# Patient Record
Sex: Female | Born: 1985 | Race: White | Hispanic: No | Smoking: Never smoker
Health system: Southern US, Community
[De-identification: ages and names within clinical notes are randomized; demographics above are authoritative.]

## PROBLEM LIST (undated history)

## (undated) DIAGNOSIS — D649 Anemia, unspecified: Secondary | ICD-10-CM

## (undated) DIAGNOSIS — F419 Anxiety disorder, unspecified: Secondary | ICD-10-CM

## (undated) DIAGNOSIS — I10 Essential (primary) hypertension: Secondary | ICD-10-CM

## (undated) DIAGNOSIS — K760 Fatty (change of) liver, not elsewhere classified: Secondary | ICD-10-CM

## (undated) DIAGNOSIS — Z9889 Other specified postprocedural states: Secondary | ICD-10-CM

## (undated) DIAGNOSIS — J209 Acute bronchitis, unspecified: Secondary | ICD-10-CM

## (undated) DIAGNOSIS — R0600 Dyspnea, unspecified: Secondary | ICD-10-CM

## (undated) DIAGNOSIS — F909 Attention-deficit hyperactivity disorder, unspecified type: Secondary | ICD-10-CM

## (undated) DIAGNOSIS — F411 Generalized anxiety disorder: Secondary | ICD-10-CM

## (undated) DIAGNOSIS — Z903 Acquired absence of stomach [part of]: Secondary | ICD-10-CM

## (undated) DIAGNOSIS — G4730 Sleep apnea, unspecified: Secondary | ICD-10-CM

## (undated) DIAGNOSIS — S92351A Displaced fracture of fifth metatarsal bone, right foot, initial encounter for closed fracture: Secondary | ICD-10-CM

## (undated) DIAGNOSIS — Z8719 Personal history of other diseases of the digestive system: Secondary | ICD-10-CM

## (undated) DIAGNOSIS — F32A Depression, unspecified: Secondary | ICD-10-CM

## (undated) DIAGNOSIS — J45909 Unspecified asthma, uncomplicated: Secondary | ICD-10-CM

## (undated) DIAGNOSIS — K912 Postsurgical malabsorption, not elsewhere classified: Secondary | ICD-10-CM

## (undated) DIAGNOSIS — F321 Major depressive disorder, single episode, moderate: Secondary | ICD-10-CM

## (undated) HISTORY — PX: WISDOM TOOTH EXTRACTION: SHX21

## (undated) HISTORY — PX: ESOPHAGOGASTRODUODENOSCOPY: SHX1529

## (undated) HISTORY — PX: LAPAROSCOPIC GASTRIC RESTRICTIVE DUODENAL PROCEDURE (DUODENAL SWITCH): SHX6667

## (undated) HISTORY — PX: CHOLECYSTECTOMY: SHX55

---
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
## 2007-11-21 ENCOUNTER — Emergency Department: Payer: Self-pay | Admitting: Emergency Medicine

## 2009-02-02 ENCOUNTER — Emergency Department: Payer: Self-pay | Admitting: Internal Medicine

## 2009-04-24 ENCOUNTER — Emergency Department: Payer: Self-pay | Admitting: Emergency Medicine

## 2011-06-10 ENCOUNTER — Observation Stay: Payer: Self-pay | Admitting: Obstetrics and Gynecology

## 2011-07-09 ENCOUNTER — Observation Stay: Payer: Self-pay

## 2011-07-10 ENCOUNTER — Inpatient Hospital Stay: Payer: Self-pay | Admitting: Obstetrics and Gynecology

## 2011-07-10 LAB — CBC WITH DIFFERENTIAL/PLATELET
Basophil #: 0.1 10*3/uL (ref 0.0–0.1)
Eosinophil #: 0.2 10*3/uL (ref 0.0–0.7)
Eosinophil %: 1.4 %
HCT: 33.7 % — ABNORMAL LOW (ref 35.0–47.0)
Lymphocyte #: 2.2 10*3/uL (ref 1.0–3.6)
MCV: 85 fL (ref 80–100)
Monocyte #: 0.6 x10 3/mm (ref 0.2–0.9)
Monocyte %: 4.6 %
Neutrophil #: 9.3 10*3/uL — ABNORMAL HIGH (ref 1.4–6.5)
Neutrophil %: 75 %
WBC: 12.4 10*3/uL — ABNORMAL HIGH (ref 3.6–11.0)

## 2011-07-11 LAB — HEMATOCRIT: HCT: 32.5 % — ABNORMAL LOW (ref 35.0–47.0)

## 2012-04-07 ENCOUNTER — Emergency Department: Payer: Self-pay | Admitting: Emergency Medicine

## 2013-06-26 ENCOUNTER — Ambulatory Visit: Payer: Self-pay | Admitting: Bariatrics

## 2013-06-26 LAB — COMPREHENSIVE METABOLIC PANEL
ALK PHOS: 84 U/L
ALT: 23 U/L (ref 12–78)
ANION GAP: 5 — AB (ref 7–16)
AST: 19 U/L (ref 15–37)
Albumin: 3.4 g/dL (ref 3.4–5.0)
BILIRUBIN TOTAL: 0.3 mg/dL (ref 0.2–1.0)
BUN: 13 mg/dL (ref 7–18)
Calcium, Total: 8.7 mg/dL (ref 8.5–10.1)
Chloride: 107 mmol/L (ref 98–107)
Co2: 28 mmol/L (ref 21–32)
Creatinine: 0.73 mg/dL (ref 0.60–1.30)
EGFR (African American): >60
EGFR (Non-African Amer.): >60
Glucose: 101 mg/dL — ABNORMAL HIGH (ref 65–99)
Osmolality: 280 (ref 275–301)
Potassium: 3.9 mmol/L (ref 3.5–5.1)
SODIUM: 140 mmol/L (ref 136–145)
TOTAL PROTEIN: 6.9 g/dL (ref 6.4–8.2)

## 2013-06-26 LAB — TSH: Thyroid Stimulating Horm: 1.93 u[IU]/mL

## 2013-06-26 LAB — CBC WITH DIFFERENTIAL/PLATELET
BASOS PCT: 0.3 %
Basophil #: 0 10*3/uL (ref 0.0–0.1)
EOS ABS: 0.2 10*3/uL (ref 0.0–0.7)
Eosinophil %: 2.8 %
HCT: 34.4 % — ABNORMAL LOW (ref 35.0–47.0)
HGB: 11.2 g/dL — AB (ref 12.0–16.0)
Lymphocyte #: 1.7 10*3/uL (ref 1.0–3.6)
Lymphocyte %: 20.2 %
MCH: 25.4 pg — ABNORMAL LOW (ref 26.0–34.0)
MCHC: 32.5 g/dL (ref 32.0–36.0)
MCV: 78 fL — ABNORMAL LOW (ref 80–100)
MONO ABS: 0.5 x10 3/mm (ref 0.2–0.9)
MONOS PCT: 5.6 %
Neutrophil #: 6.1 10*3/uL (ref 1.4–6.5)
Neutrophil %: 71.1 %
Platelet: 264 10*3/uL (ref 150–440)
RBC: 4.41 10*6/uL (ref 3.80–5.20)
RDW: 14.9 % — ABNORMAL HIGH (ref 11.5–14.5)
WBC: 8.5 10*3/uL (ref 3.6–11.0)

## 2013-06-26 LAB — PROTIME-INR
INR: 1
Prothrombin Time: 13.1 secs (ref 11.5–14.7)

## 2013-06-26 LAB — PHOSPHORUS: Phosphorus: 3.4 mg/dL (ref 2.5–4.9)

## 2013-06-26 LAB — BILIRUBIN, DIRECT: Bilirubin, Direct: 0.1 mg/dL (ref 0.00–0.20)

## 2013-06-26 LAB — IRON AND TIBC
IRON: 48 ug/dL — AB (ref 50–170)
Iron Bind.Cap.(Total): 371 ug/dL (ref 250–450)
Iron Saturation: 13 %
Unbound Iron-Bind.Cap.: 323 ug/dL

## 2013-06-26 LAB — LIPASE, BLOOD: LIPASE: 165 U/L (ref 73–393)

## 2013-06-26 LAB — MAGNESIUM: Magnesium: 1.6 mg/dL — ABNORMAL LOW

## 2013-06-26 LAB — HEMOGLOBIN A1C: Hemoglobin A1C: 5.6 % (ref 4.2–6.3)

## 2013-06-26 LAB — FOLATE: Folic Acid: 18.6 ng/mL (ref 3.1–100.0)

## 2013-06-26 LAB — APTT: Activated PTT: 28.6 secs (ref 23.6–35.9)

## 2013-06-26 LAB — AMYLASE: AMYLASE: 48 U/L (ref 25–115)

## 2013-06-26 LAB — FERRITIN: FERRITIN (ARMC): 11 ng/mL (ref 8–388)

## 2013-07-17 ENCOUNTER — Ambulatory Visit: Payer: Self-pay | Admitting: Bariatrics

## 2013-08-01 ENCOUNTER — Ambulatory Visit: Payer: Self-pay | Admitting: Bariatrics

## 2013-09-22 HISTORY — PX: ABDOMINAL SURGERY: SHX537

## 2013-09-22 HISTORY — PX: HERNIA REPAIR: SHX51

## 2013-09-22 HISTORY — PX: GASTRIC BYPASS: SHX52

## 2014-07-02 NOTE — H&P (Signed)
L&D Evaluation:  History:   HPI 29 y/o G2 P1001 @ 39wks EDC 07/10/11 arrives with c/o SROM clear fluid 0530. Irreg mild uc's, small amount bloody show, baby is active. PNC @ [facility] Morbid obesity, anemia, asthma, GBS negative. GBS negative    Presents with leaking fluid    Patient's Medical History Asthma  Morbid Obesity    Patient's Surgical History none    Medications Pre Natal Vitamins  quvar, proventil, albuterol,    Allergies NKDA    Social History none    Family History Non-Contributory   ROS:   ROS All systems were reviewed.  HEENT, CNS, GI, GU, Respiratory, CV, Renal and Musculoskeletal systems were found to be normal.   Exam:   Vital Signs stable    Urine Protein not completed    General no apparent distress    Mental Status clear    Chest clear    Heart normal sinus rhythm    Abdomen gravid, non-tender    Estimated Fetal Weight Average for gestational age    Fetal Position vtx    Fundal Height term+    Back no CVAT    Edema 2+  pedal    Reflexes 2+    Clonus negative    Pelvic no external lesions, 4cm 50% vtx @ -2 clear fluid nl show    Description clear    FHT baseline 130's 140's avg variability with accels    FHT Description 136    Ucx irregular, mild    Skin dry    Lymph no lymphadenopathy   Impression:   Impression early labor   Plan:   Plan monitor contractions and for cervical change    Comments Ambulating, uc's continue to be mild asnd irregular. WIll begin pitocin augment. DC pain management options, plans IV pain meds, may request epidural. KNows body habitus possible limitations with epidural. FOB and friend at bedside supportive.   Electronic Signatures: Albertina ParrLugiano, Kino Dunsworth B (CNM)  (Signed 18-May-13 09:26)  Authored: L&D Evaluation   Last Updated: 18-May-13 09:26 by Albertina ParrLugiano, Samyak Sackmann B (CNM)

## 2014-11-29 DIAGNOSIS — J45909 Unspecified asthma, uncomplicated: Secondary | ICD-10-CM | POA: Insufficient documentation

## 2014-11-29 DIAGNOSIS — J4520 Mild intermittent asthma, uncomplicated: Secondary | ICD-10-CM | POA: Insufficient documentation

## 2014-12-09 DIAGNOSIS — Z01419 Encounter for gynecological examination (general) (routine) without abnormal findings: Secondary | ICD-10-CM | POA: Insufficient documentation

## 2014-12-09 DIAGNOSIS — Z903 Acquired absence of stomach [part of]: Secondary | ICD-10-CM | POA: Insufficient documentation

## 2015-05-12 ENCOUNTER — Emergency Department
Admission: EM | Admit: 2015-05-12 | Discharge: 2015-05-12 | Disposition: A | Discharge status: Other Acute Inpatient Hospital | Payer: PRIVATE HEALTH INSURANCE | Attending: Emergency Medicine | Admitting: Emergency Medicine

## 2015-05-12 ENCOUNTER — Encounter: Payer: Self-pay | Admitting: Emergency Medicine

## 2015-05-12 ENCOUNTER — Emergency Department: Payer: PRIVATE HEALTH INSURANCE

## 2015-05-12 DIAGNOSIS — Z79899 Other long term (current) drug therapy: Secondary | ICD-10-CM | POA: Insufficient documentation

## 2015-05-12 DIAGNOSIS — R1011 Right upper quadrant pain: Secondary | ICD-10-CM | POA: Insufficient documentation

## 2015-05-12 DIAGNOSIS — Z9884 Bariatric surgery status: Secondary | ICD-10-CM | POA: Diagnosis not present

## 2015-05-12 DIAGNOSIS — R109 Unspecified abdominal pain: Secondary | ICD-10-CM

## 2015-05-12 HISTORY — DX: Unspecified asthma, uncomplicated: J45.909

## 2015-05-12 LAB — URINALYSIS COMPLETE WITH MICROSCOPIC (ARMC ONLY)
BACTERIA UA: NONE SEEN
Bilirubin Urine: NEGATIVE
Glucose, UA: NEGATIVE mg/dL
Ketones, ur: NEGATIVE mg/dL
Leukocytes, UA: NEGATIVE
NITRITE: NEGATIVE
PH: 6 (ref 5.0–8.0)
PROTEIN: NEGATIVE mg/dL
SPECIFIC GRAVITY, URINE: 1.018 (ref 1.005–1.030)

## 2015-05-12 LAB — CBC WITH DIFFERENTIAL/PLATELET
BASOS ABS: 0 10*3/uL (ref 0–0.1)
BASOS PCT: 0 %
EOS ABS: 0.1 10*3/uL (ref 0–0.7)
EOS PCT: 2 %
HCT: 30 % — ABNORMAL LOW (ref 35.0–47.0)
Hemoglobin: 9.5 g/dL — ABNORMAL LOW (ref 12.0–16.0)
LYMPHS PCT: 29 %
Lymphs Abs: 2.2 10*3/uL (ref 1.0–3.6)
MCH: 22.4 pg — ABNORMAL LOW (ref 26.0–34.0)
MCHC: 31.8 g/dL — ABNORMAL LOW (ref 32.0–36.0)
MCV: 70.6 fL — AB (ref 80.0–100.0)
Monocytes Absolute: 0.5 10*3/uL (ref 0.2–0.9)
Monocytes Relative: 6 %
Neutro Abs: 4.7 10*3/uL (ref 1.4–6.5)
Neutrophils Relative %: 63 %
PLATELETS: 282 10*3/uL (ref 150–440)
RBC: 4.25 MIL/uL (ref 3.80–5.20)
RDW: 17 % — ABNORMAL HIGH (ref 11.5–14.5)
WBC: 7.6 10*3/uL (ref 3.6–11.0)

## 2015-05-12 LAB — COMPREHENSIVE METABOLIC PANEL
ALBUMIN: 3.9 g/dL (ref 3.5–5.0)
ALK PHOS: 195 U/L — AB (ref 38–126)
ALT: 58 U/L — AB (ref 14–54)
ANION GAP: 4 — AB (ref 5–15)
AST: 121 U/L — ABNORMAL HIGH (ref 15–41)
BUN: 11 mg/dL (ref 6–20)
CALCIUM: 8.3 mg/dL — AB (ref 8.9–10.3)
CO2: 27 mmol/L (ref 22–32)
CREATININE: 0.65 mg/dL (ref 0.44–1.00)
Chloride: 107 mmol/L (ref 101–111)
GFR calc Af Amer: >60 mL/min (ref >60)
GFR calc non Af Amer: >60 mL/min (ref >60)
GLUCOSE: 103 mg/dL — AB (ref 65–99)
Potassium: 3 mmol/L — ABNORMAL LOW (ref 3.5–5.1)
SODIUM: 138 mmol/L (ref 135–145)
Total Bilirubin: 0.4 mg/dL (ref 0.3–1.2)
Total Protein: 7.1 g/dL (ref 6.5–8.1)

## 2015-05-12 LAB — LIPASE, BLOOD: Lipase: 28 U/L (ref 11–51)

## 2015-05-12 MED ORDER — MORPHINE SULFATE (PF) 4 MG/ML IV SOLN
INTRAVENOUS | Status: AC
  Administered 2015-05-12: 4 mg via INTRAVENOUS
  Filled 2015-05-12: qty 1

## 2015-05-12 MED ORDER — ONDANSETRON HCL 4 MG/2ML IJ SOLN: 4.0000 mg | Freq: Once | INTRAMUSCULAR | Status: DC

## 2015-05-12 MED ORDER — MORPHINE SULFATE (PF) 4 MG/ML IV SOLN
4.0000 mg | Freq: Once | INTRAVENOUS | Status: AC
  Administered 2015-05-12: 4 mg via INTRAVENOUS
  Filled 2015-05-12: qty 1

## 2015-05-12 MED ORDER — ONDANSETRON HCL 4 MG/2ML IJ SOLN
4.0000 mg | Freq: Once | INTRAMUSCULAR | Status: AC
  Administered 2015-05-12: 4 mg via INTRAVENOUS

## 2015-05-12 MED ORDER — SODIUM CHLORIDE 0.9 % IV BOLUS (SEPSIS)
500.0000 mL | Freq: Once | INTRAVENOUS | Status: AC
  Administered 2015-05-12: 500 mL via INTRAVENOUS

## 2015-05-12 MED ORDER — ONDANSETRON HCL 4 MG/2ML IJ SOLN
4.0000 mg | Freq: Once | INTRAMUSCULAR | Status: AC
  Administered 2015-05-12: 4 mg via INTRAVENOUS
  Filled 2015-05-12: qty 2

## 2015-05-12 MED ORDER — ONDANSETRON HCL 4 MG/2ML IJ SOLN
INTRAMUSCULAR | Status: AC
  Filled 2015-05-12: qty 2

## 2015-05-12 MED ORDER — MORPHINE SULFATE (PF) 4 MG/ML IV SOLN
4.0000 mg | Freq: Once | INTRAVENOUS | Status: AC
  Administered 2015-05-12: 4 mg via INTRAVENOUS

## 2015-05-12 MED ORDER — MORPHINE SULFATE (PF) 2 MG/ML IV SOLN: 2.0000 mg | Freq: Once | INTRAVENOUS | Status: DC

## 2015-05-12 NOTE — ED Notes (Signed)
Pt states bilateral upper quadrant pain that is worse with yawning or "burping" per pt. Pt states has had pain several times in past, had ultrasound of her gallbladder and liver and "everything was fine, they couldn't find anything wrong." skin pwd, pt appears in no acute distress. Pt denies known fever. Pt with chronic soft stools due to "weight loss surgery."

## 2015-05-12 NOTE — ED Notes (Signed)
Pt ambulatory to triage CO abdominal 9/10 in the epigastric area spreading across umbilicus area, pt had gastric bypass (duodenal switch) aug 2015, Dr Alva Garnetyner.   Pt reports this is the 3rd occurrence of this type of pain in the last month.  Pain reported as sharp and pt reports unablew to get relief by position, this pain woke pt.  Pt reports having her gallbladder and appendix.   Pt areas grimacing, groaning  and restless in triage.

## 2015-05-12 NOTE — ED Notes (Signed)
Pt remains in ultrasound.

## 2015-05-12 NOTE — ED Provider Notes (Addendum)
[facility] Provider Note  ____________________________________________   I have reviewed the triage vital signs and the nursing notes.   HISTORY  Chief Complaint Abdominal Pain    HPI Brooke Pearson is a 30 y.o. female who had gastric bypass surgery approximately a year and a half ago with a significant weight loss since then. States that prior to her surgery she did have an ultrasound which was negative for gallstones. States over the last months that to 3 episodes of right upper quadrant pain which has been significant. The last 2 times it was transitory this time, however, it lasted. She denies any fever or chills or nausea or vomiting. No diarrhea or dysuria.She states that aside from a focal right upper quadrant pain she doesn't feel unwell.  Past Medical History  Diagnosis Date  . Asthma     There are no active problems to display for this patient.   Past Surgical History  Procedure Laterality Date  . Abdominal surgery  aug 2015    gastric bypass  . Gastric bypass  aug 2015  . Hernia repair  aug 2015    Current Outpatient Rx  Name  Route  Sig  Dispense  Refill  . citalopram (CELEXA) 10 MG tablet   Oral   Take 1 tablet by mouth daily.         Marland Kitchen. etonogestrel-ethinyl estradiol (NUVARING) 0.12-0.015 MG/24HR vaginal ring   Vaginal   Place vaginally.         . traZODone (DESYREL) 50 MG tablet   Oral   Take 1 tablet by mouth at bedtime.           Allergies Review of patient's allergies indicates no known allergies.  History reviewed. No pertinent family history.  Social History Social History  Substance Use Topics  . Smoking status: Never Smoker   . Smokeless tobacco: None  . Alcohol Use: Yes     Comment: social    Review of Systems Constitutional: No fever/chills Eyes: No visual changes. ENT: No sore throat. No stiff neck no neck pain Cardiovascular:  Denies chest pain. Respiratory: Denies shortness of breath. Gastrointestinal:   no vomiting.  No diarrhea.  No constipation. Genitourinary: Negative for dysuria. Musculoskeletal: Negative lower extremity swelling Skin: Negative for rash. Neurological: Negative for headaches, focal weakness or numbness. 10-point ROS otherwise negative.  ____________________________________________   PHYSICAL EXAM:  VITAL SIGNS: ED Triage Vitals  Enc Vitals Group     BP 05/12/15 0133 144/90 mmHg     Pulse Rate 05/12/15 0133 82     Resp 05/12/15 0133 18     Temp 05/12/15 0133 98.2 F (36.8 C)     Temp Source 05/12/15 0133 Oral     SpO2 05/12/15 0133 100 %     Weight 05/12/15 0133 215 lb (97.523 kg)     Height 05/12/15 0133 5\' 7"  (1.702 m)     Head Cir --      Peak Flow --      Pain Score 05/12/15 0153 9     Pain Loc --      Pain Edu? --      Excl. in GC? --     Constitutional: Alert and oriented. Well appearing and in no acute distress. Eyes: Conjunctivae are normal. PERRL. EOMI. Head: Atraumatic. Nose: No congestion/rhinnorhea. Mouth/Throat: Mucous membranes are moist.  Oropharynx non-erythematous. Neck: No stridor.   Nontender with no meningismus Cardiovascular: Normal rate, regular rhythm. Grossly  normal heart sounds.  Good peripheral circulation. Respiratory: Normal respiratory effort.  No retractions. Lungs CTAB. Abdominal: She is tender to palpation the right upper quadrant which reproduces her pain.. No distention. No guarding no rebound Back:  There is no focal tenderness or step off there is no midline tenderness there are no lesions noted. there is no CVA tenderness Musculoskeletal: No lower extremity tenderness. No joint effusions, no DVT signs strong distal pulses no edema Neurologic:  Normal speech and language. No gross focal neurologic deficits are appreciated.  Skin:  Skin is warm, dry and intact. No rash noted. Psychiatric: Mood and affect are normal. Speech and behavior  are normal.  ____________________________________________   LABS (all labs ordered are listed, but only abnormal results are displayed)  Labs Reviewed  COMPREHENSIVE METABOLIC PANEL - Abnormal; Notable for the following:    Potassium 3.0 (*)    Glucose, Bld 103 (*)    Calcium 8.3 (*)    AST 121 (*)    ALT 58 (*)    Alkaline Phosphatase 195 (*)    Anion gap 4 (*)    All other components within normal limits  URINALYSIS COMPLETEWITH MICROSCOPIC ([facility] ONLY) - Abnormal; Notable for the following:    Color, Urine YELLOW (*)    APPearance CLEAR (*)    Hgb urine dipstick 1+ (*)    Squamous Epithelial / LPF 0-5 (*)    All other components within normal limits  CBC WITH DIFFERENTIAL/PLATELET - Abnormal; Notable for the following:    Hemoglobin 9.5 (*)    HCT 30.0 (*)    MCV 70.6 (*)    MCH 22.4 (*)    MCHC 31.8 (*)    RDW 17.0 (*)    All other components within normal limits  LIPASE, BLOOD   ____________________________________________  EKG  I personally interpreted any EKGs ordered by me or triage  ____________________________________________  RADIOLOGY  I reviewed any imaging ordered by me or triage that were performed during my shift and, if possible, patient and/or family made aware of any abnormal findings. ____________________________________________   PROCEDURES  Procedure(s) performed: None  Critical Care performed: None  ____________________________________________   INITIAL IMPRESSION / ASSESSMENT AND PLAN / ED COURSE  Pertinent labs & imaging results that were available during my care of the patient were reviewed by me and considered in my medical decision making (see chart for details).  Patient with a nonsurgical abdomen presents with right upper quadrant pain. She has had however gastric bypass with somewhat palpates the situation. However her symptoms a classically similar to gallbladder symptoms given the recurrent nature, the focal nature in the  right upper quadrant, and her elevated alkaline phosphatase and the reflection test and I did begin with an ultrasound which does show gallstones. I do suspect this is biliary colic. Her abdomen is otherwise benign. Blood work is otherwise reassuring. We have given her pain medication we will see how she feels. At this time, given her ultrasound, this is more in the realm of biliary colic then acute cholecystitis at least in terms of imaging. __________  ----------------------------------------- 7:42 AM on 05/12/2015 -----------------------------------------  Discussed with the on-call PA for her surgeon, Dr. Alva Garnet, they will call back with disposition. Signed out at the end of my shift to Dr. Cyril Loosen  __________________________________   FINAL CLINICAL IMPRESSION(S) / ED DIAGNOSES  Final diagnoses:  Abdominal pain      This chart was dictated using voice recognition software.  Despite best efforts to proofread,  errors can occur which can change meaning.     Jeanmarie Plant, MD 05/12/15 1610  Jeanmarie Plant, MD 05/12/15 [phone]

## 2015-05-12 NOTE — ED Notes (Signed)
Pt with gallstones per ct scan. Possible admission.

## 2015-05-12 NOTE — ED Notes (Signed)
Pt returned from ultrasound

## 2015-05-12 NOTE — ED Notes (Signed)
[facility], will be about an hour an a half before they arrive.

## 2015-05-12 NOTE — ED Notes (Signed)
Pt to ultrasound

## 2015-05-12 NOTE — ED Notes (Signed)
Pt resting on stretcher [facility].

## 2015-05-12 NOTE — ED Notes (Signed)
Warm blankets provided, call bell at side. Lights dimmed for comfort.

## 2015-05-12 NOTE — ED Notes (Signed)
Report to monica, rn

## 2015-11-02 ENCOUNTER — Observation Stay
Admission: EM | Admit: 2015-11-02 | Discharge: 2015-11-04 | Disposition: A | Discharge status: Home / Self Care | Payer: PRIVATE HEALTH INSURANCE | Attending: Surgery | Admitting: Surgery

## 2015-11-02 ENCOUNTER — Encounter
Admission: EM | Disposition: A | Discharge status: Home / Self Care | Payer: Self-pay | Source: Home / Self Care | Attending: Emergency Medicine

## 2015-11-02 ENCOUNTER — Observation Stay: Payer: PRIVATE HEALTH INSURANCE | Admitting: Anesthesiology

## 2015-11-02 ENCOUNTER — Emergency Department: Payer: PRIVATE HEALTH INSURANCE

## 2015-11-02 DIAGNOSIS — K3580 Unspecified acute appendicitis: Secondary | ICD-10-CM | POA: Diagnosis not present

## 2015-11-02 DIAGNOSIS — N83521 Torsion of right fallopian tube: Secondary | ICD-10-CM | POA: Insufficient documentation

## 2015-11-02 DIAGNOSIS — J45909 Unspecified asthma, uncomplicated: Secondary | ICD-10-CM | POA: Insufficient documentation

## 2015-11-02 DIAGNOSIS — Z79891 Long term (current) use of opiate analgesic: Secondary | ICD-10-CM | POA: Insufficient documentation

## 2015-11-02 DIAGNOSIS — Z23 Encounter for immunization: Secondary | ICD-10-CM | POA: Insufficient documentation

## 2015-11-02 DIAGNOSIS — Z79899 Other long term (current) drug therapy: Secondary | ICD-10-CM | POA: Diagnosis not present

## 2015-11-02 DIAGNOSIS — Z793 Long term (current) use of hormonal contraceptives: Secondary | ICD-10-CM | POA: Insufficient documentation

## 2015-11-02 DIAGNOSIS — Z9889 Other specified postprocedural states: Secondary | ICD-10-CM | POA: Insufficient documentation

## 2015-11-02 DIAGNOSIS — N83529 Torsion of fallopian tube, unspecified side: Secondary | ICD-10-CM

## 2015-11-02 DIAGNOSIS — K353 Acute appendicitis with localized peritonitis, without perforation or gangrene: Secondary | ICD-10-CM

## 2015-11-02 DIAGNOSIS — Z9884 Bariatric surgery status: Secondary | ICD-10-CM | POA: Diagnosis not present

## 2015-11-02 DIAGNOSIS — Z9049 Acquired absence of other specified parts of digestive tract: Secondary | ICD-10-CM | POA: Diagnosis not present

## 2015-11-02 HISTORY — PX: LAPAROSCOPIC UNILATERAL SALPINGECTOMY: SHX5934

## 2015-11-02 HISTORY — DX: Torsion of fallopian tube, unspecified side: N83.529

## 2015-11-02 HISTORY — PX: LAPAROSCOPIC APPENDECTOMY: SHX408

## 2015-11-02 LAB — COMPREHENSIVE METABOLIC PANEL WITH GFR
ALT: 18 U/L (ref 14–54)
AST: 15 U/L (ref 15–41)
Albumin: 3.7 g/dL (ref 3.5–5.0)
Alkaline Phosphatase: 105 U/L (ref 38–126)
Anion gap: 4 — ABNORMAL LOW (ref 5–15)
BUN: 10 mg/dL (ref 6–20)
CO2: 24 mmol/L (ref 22–32)
Calcium: 8.7 mg/dL — ABNORMAL LOW (ref 8.9–10.3)
Chloride: 111 mmol/L (ref 101–111)
Creatinine, Ser: 0.58 mg/dL (ref 0.44–1.00)
GFR calc Af Amer: >60 mL/min
GFR calc non Af Amer: >60 mL/min
Glucose, Bld: 91 mg/dL (ref 65–99)
Potassium: 3.4 mmol/L — ABNORMAL LOW (ref 3.5–5.1)
Sodium: 139 mmol/L (ref 135–145)
Total Bilirubin: 0.5 mg/dL (ref 0.3–1.2)
Total Protein: 7 g/dL (ref 6.5–8.1)

## 2015-11-02 LAB — POCT PREGNANCY, URINE: Preg Test, Ur: NEGATIVE

## 2015-11-02 LAB — URINALYSIS COMPLETE WITH MICROSCOPIC (ARMC ONLY)
Bilirubin Urine: NEGATIVE
Glucose, UA: NEGATIVE mg/dL
Hgb urine dipstick: NEGATIVE
Ketones, ur: NEGATIVE mg/dL
Nitrite: NEGATIVE
Protein, ur: NEGATIVE mg/dL
Specific Gravity, Urine: 1.024 (ref 1.005–1.030)
pH: 5 (ref 5.0–8.0)

## 2015-11-02 LAB — LIPASE, BLOOD: Lipase: 28 U/L (ref 11–51)

## 2015-11-02 LAB — CBC
HCT: 28.6 % — ABNORMAL LOW (ref 35.0–47.0)
Hemoglobin: 9.3 g/dL — ABNORMAL LOW (ref 12.0–16.0)
MCH: 22.2 pg — ABNORMAL LOW (ref 26.0–34.0)
MCHC: 32.6 g/dL (ref 32.0–36.0)
MCV: 68.1 fL — ABNORMAL LOW (ref 80.0–100.0)
Platelets: 227 K/uL (ref 150–440)
RBC: 4.2 MIL/uL (ref 3.80–5.20)
RDW: 18.6 % — ABNORMAL HIGH (ref 11.5–14.5)
WBC: 8.2 K/uL (ref 3.6–11.0)

## 2015-11-02 SURGERY — APPENDECTOMY, LAPAROSCOPIC
Anesthesia: General | Laterality: Right | Wound class: Clean Contaminated

## 2015-11-02 MED ORDER — GLYCOPYRROLATE 0.2 MG/ML IJ SOLN
INTRAMUSCULAR | Status: DC | PRN
  Administered 2015-11-02: .6 mg via INTRAVENOUS

## 2015-11-02 MED ORDER — MIDAZOLAM HCL 2 MG/2ML IJ SOLN
INTRAMUSCULAR | Status: DC | PRN
  Administered 2015-11-02: 2 mg via INTRAVENOUS

## 2015-11-02 MED ORDER — PIPERACILLIN-TAZOBACTAM 3.375 G IVPB
INTRAVENOUS | Status: AC
Start: 2015-11-02 — End: 2015-11-02
  Administered 2015-11-02: 3.375 g via INTRAVENOUS
  Filled 2015-11-02: qty 50

## 2015-11-02 MED ORDER — KETOROLAC TROMETHAMINE 30 MG/ML IJ SOLN
INTRAMUSCULAR | Status: DC | PRN
  Administered 2015-11-02: 30 mg via INTRAVENOUS

## 2015-11-02 MED ORDER — PROPOFOL 10 MG/ML IV BOLUS
INTRAVENOUS | Status: DC | PRN
  Administered 2015-11-02: 200 mg via INTRAVENOUS

## 2015-11-02 MED ORDER — MORPHINE SULFATE (PF) 4 MG/ML IV SOLN
4.0000 mg | Freq: Once | INTRAVENOUS | Status: AC
  Administered 2015-11-02: 4 mg via INTRAVENOUS

## 2015-11-02 MED ORDER — OXYCODONE HCL 5 MG PO TABS: 5.0000 mg | ORAL_TABLET | Freq: Once | ORAL | Status: DC | PRN

## 2015-11-02 MED ORDER — PIPERACILLIN-TAZOBACTAM 3.375 G IVPB
3.3750 g | Freq: Three times a day (TID) | INTRAVENOUS | Status: DC
Start: 2015-11-03 — End: 2015-11-02
  Filled 2015-11-02 (×2): qty 50

## 2015-11-02 MED ORDER — PIPERACILLIN-TAZOBACTAM 3.375 G IVPB 30 MIN
3.3750 g | Freq: Once | INTRAVENOUS | Status: AC
  Administered 2015-11-02: 3.375 g via INTRAVENOUS

## 2015-11-02 MED ORDER — IOPAMIDOL (ISOVUE-300) INJECTION 61%
30.0000 mL | Freq: Once | INTRAVENOUS | Status: AC | PRN
  Administered 2015-11-02: 30 mL via ORAL
  Filled 2015-11-02: qty 30

## 2015-11-02 MED ORDER — IOPAMIDOL (ISOVUE-300) INJECTION 61%
100.0000 mL | Freq: Once | INTRAVENOUS | Status: AC | PRN
  Administered 2015-11-02: 100 mL via INTRAVENOUS
  Filled 2015-11-02: qty 100

## 2015-11-02 MED ORDER — FENTANYL CITRATE (PF) 100 MCG/2ML IJ SOLN
INTRAMUSCULAR | Status: DC | PRN
  Administered 2015-11-02 (×2): 100 ug via INTRAVENOUS

## 2015-11-02 MED ORDER — MORPHINE SULFATE (PF) 4 MG/ML IV SOLN
INTRAVENOUS | Status: AC
  Filled 2015-11-02: qty 1

## 2015-11-02 MED ORDER — LACTATED RINGERS IV SOLN
INTRAVENOUS | Status: DC | PRN
  Administered 2015-11-02 (×2): via INTRAVENOUS

## 2015-11-02 MED ORDER — ACETAMINOPHEN 650 MG RE SUPP: 650.0000 mg | Freq: Four times a day (QID) | RECTAL | Status: DC | PRN

## 2015-11-02 MED ORDER — ALBUTEROL SULFATE (2.5 MG/3ML) 0.083% IN NEBU
3.0000 mL | INHALATION_SOLUTION | RESPIRATORY_TRACT | Status: DC | PRN
  Administered 2015-11-02 – 2015-11-04 (×3): 3 mL via RESPIRATORY_TRACT
  Filled 2015-11-02 (×2): qty 3
  Filled 2015-11-02: qty 18

## 2015-11-02 MED ORDER — MORPHINE SULFATE (PF) 4 MG/ML IV SOLN
4.0000 mg | INTRAVENOUS | Status: DC | PRN
  Administered 2015-11-02 – 2015-11-03 (×2): 4 mg via INTRAVENOUS
  Filled 2015-11-02 (×2): qty 1

## 2015-11-02 MED ORDER — SODIUM CHLORIDE 0.9 % IV SOLN
INTRAVENOUS | Status: DC | PRN
  Administered 2015-11-02: 1000 mL via INTRAMUSCULAR

## 2015-11-02 MED ORDER — HYDROCODONE-ACETAMINOPHEN 5-325 MG PO TABS
1.0000 | ORAL_TABLET | ORAL | Status: DC | PRN
  Administered 2015-11-03 (×2): 1 via ORAL
  Filled 2015-11-02 (×2): qty 1

## 2015-11-02 MED ORDER — ONDANSETRON 4 MG PO TBDP
4.0000 mg | ORAL_TABLET | Freq: Four times a day (QID) | ORAL | Status: DC | PRN
  Administered 2015-11-03: 4 mg via ORAL
  Filled 2015-11-02: qty 1

## 2015-11-02 MED ORDER — ONDANSETRON HCL 4 MG/2ML IJ SOLN
4.0000 mg | Freq: Four times a day (QID) | INTRAMUSCULAR | Status: DC | PRN
  Administered 2015-11-02 – 2015-11-04 (×4): 4 mg via INTRAVENOUS
  Filled 2015-11-02 (×5): qty 2

## 2015-11-02 MED ORDER — DEXAMETHASONE SODIUM PHOSPHATE 10 MG/ML IJ SOLN
INTRAMUSCULAR | Status: DC | PRN
  Administered 2015-11-02: 5 mg via INTRAVENOUS

## 2015-11-02 MED ORDER — OXYCODONE HCL 5 MG/5ML PO SOLN: 5.0000 mg | Freq: Once | ORAL | Status: DC | PRN

## 2015-11-02 MED ORDER — ONDANSETRON HCL 4 MG/2ML IJ SOLN
4.0000 mg | Freq: Once | INTRAMUSCULAR | Status: AC
  Administered 2015-11-02: 4 mg via INTRAVENOUS

## 2015-11-02 MED ORDER — NEOSTIGMINE METHYLSULFATE 10 MG/10ML IV SOLN
INTRAVENOUS | Status: DC | PRN
  Administered 2015-11-02: 4 mg via INTRAVENOUS

## 2015-11-02 MED ORDER — ROCURONIUM BROMIDE 100 MG/10ML IV SOLN
INTRAVENOUS | Status: DC | PRN
  Administered 2015-11-02: 35 mg via INTRAVENOUS

## 2015-11-02 MED ORDER — ACETAMINOPHEN 10 MG/ML IV SOLN
INTRAVENOUS | Status: DC | PRN
  Administered 2015-11-02: 1000 mg via INTRAVENOUS

## 2015-11-02 MED ORDER — PROMETHAZINE HCL 25 MG/ML IJ SOLN: 6.2500 mg | INTRAMUSCULAR | Status: DC | PRN

## 2015-11-02 MED ORDER — ACETAMINOPHEN 325 MG PO TABS: 650.0000 mg | ORAL_TABLET | Freq: Four times a day (QID) | ORAL | Status: DC | PRN

## 2015-11-02 MED ORDER — LIDOCAINE HCL (CARDIAC) 20 MG/ML IV SOLN
INTRAVENOUS | Status: DC | PRN
  Administered 2015-11-02: 30 mg via INTRAVENOUS

## 2015-11-02 MED ORDER — PANTOPRAZOLE SODIUM 40 MG PO TBEC
40.0000 mg | DELAYED_RELEASE_TABLET | Freq: Two times a day (BID) | ORAL | Status: DC
  Administered 2015-11-02 – 2015-11-04 (×4): 40 mg via ORAL
  Filled 2015-11-02 (×4): qty 1

## 2015-11-02 MED ORDER — KCL IN DEXTROSE-NACL 40-5-0.45 MEQ/L-%-% IV SOLN
INTRAVENOUS | Status: DC
  Administered 2015-11-02 – 2015-11-04 (×3): via INTRAVENOUS
  Filled 2015-11-02 (×6): qty 1000

## 2015-11-02 MED ORDER — ONDANSETRON HCL 4 MG/2ML IJ SOLN
INTRAMUSCULAR | Status: AC
  Administered 2015-11-02: 4 mg via INTRAVENOUS
  Filled 2015-11-02: qty 2

## 2015-11-02 MED ORDER — PNEUMOCOCCAL VAC POLYVALENT 25 MCG/0.5ML IJ INJ
0.5000 mL | INJECTION | INTRAMUSCULAR | Status: AC
  Administered 2015-11-03: 0.5 mL via INTRAMUSCULAR
  Filled 2015-11-02: qty 0.5

## 2015-11-02 MED ORDER — FENTANYL CITRATE (PF) 100 MCG/2ML IJ SOLN: 25.0000 ug | INTRAMUSCULAR | Status: DC | PRN

## 2015-11-02 MED ORDER — BUPIVACAINE HCL (PF) 0.25 % IJ SOLN
INTRAMUSCULAR | Status: DC | PRN
Start: 2015-11-02 — End: 2015-11-02
  Administered 2015-11-02: 30 mL

## 2015-11-02 MED ORDER — ONDANSETRON HCL 4 MG/2ML IJ SOLN
INTRAMUSCULAR | Status: DC | PRN
  Administered 2015-11-02: 4 mg via INTRAVENOUS

## 2015-11-02 SURGICAL SUPPLY — 37 items
ANCHOR TIS RET SYS 235ML (MISCELLANEOUS) ×4 IMPLANT
CANISTER SUCT 3000ML (MISCELLANEOUS) ×4 IMPLANT
CHLORAPREP W/TINT 26ML (MISCELLANEOUS) ×4 IMPLANT
CUTTER FLEX LINEAR 45M (STAPLE) ×4 IMPLANT
DRSG TEGADERM 2-3/8X2-3/4 SM (GAUZE/BANDAGES/DRESSINGS) ×4 IMPLANT
DRSG TELFA 3X8 NADH (GAUZE/BANDAGES/DRESSINGS) ×4 IMPLANT
ELECT REM PT RETURN 9FT ADLT (ELECTROSURGICAL) ×4
ELECTRODE REM PT RTRN 9FT ADLT (ELECTROSURGICAL) ×2 IMPLANT
ENDOPOUCH RETRIEVER 10 (MISCELLANEOUS) ×4 IMPLANT
GLOVE BIO SURGEON STRL SZ7.5 (GLOVE) ×12 IMPLANT
GLOVE INDICATOR 8.0 STRL GRN (GLOVE) ×12 IMPLANT
GOWN STRL REUS W/ TWL LRG LVL3 (GOWN DISPOSABLE) ×6 IMPLANT
GOWN STRL REUS W/TWL LRG LVL3 (GOWN DISPOSABLE) ×6
GRASPER SUT TROCAR 14GX15 (MISCELLANEOUS) ×4 IMPLANT
IRRIGATION STRYKERFLOW (MISCELLANEOUS) ×2 IMPLANT
IRRIGATOR STRYKERFLOW (MISCELLANEOUS) ×4
IV NS 1000ML (IV SOLUTION) ×4
IV NS 1000ML BAXH (IV SOLUTION) ×4 IMPLANT
KIT RM TURNOVER STRD PROC AR (KITS) ×4 IMPLANT
NEEDLE FILTER BLUNT 18X 1/2SAF (NEEDLE) ×2
NEEDLE FILTER BLUNT 18X1 1/2 (NEEDLE) ×2 IMPLANT
NEEDLE HYPO 25X1 1.5 SAFETY (NEEDLE) ×4 IMPLANT
NEEDLE INSUFFLATION 14GA 120MM (NEEDLE) ×4 IMPLANT
NS IRRIG 500ML POUR BTL (IV SOLUTION) ×4 IMPLANT
PACK LAP CHOLECYSTECTOMY (MISCELLANEOUS) ×4 IMPLANT
POUCH ENDO CATCH 10MM SPEC (MISCELLANEOUS) ×4 IMPLANT
RELOAD 45 VASCULAR/THIN (ENDOMECHANICALS) ×4 IMPLANT
SCISSORS METZENBAUM CVD 33 (INSTRUMENTS) ×4 IMPLANT
SHEARS HARMONIC ACE PLUS 36CM (ENDOMECHANICALS) ×4 IMPLANT
SUT ETHILON 5-0 FS-2 18 BLK (SUTURE) ×8 IMPLANT
SUT VIC AB 0 CT2 27 (SUTURE) ×4 IMPLANT
SYRINGE 10CC LL (SYRINGE) ×4 IMPLANT
TROCAR XCEL 12X100 BLDLESS (ENDOMECHANICALS) ×4 IMPLANT
TROCAR Z-THREAD FIOS 11X100 BL (TROCAR) ×4 IMPLANT
TROCAR Z-THREAD OPTICAL 5X100M (TROCAR) IMPLANT
TROCAR Z-THREAD SLEEVE 11X100 (TROCAR) ×4 IMPLANT
TUBING INSUFFLATOR HI FLOW (MISCELLANEOUS) ×4 IMPLANT

## 2015-11-02 NOTE — ED Notes (Signed)
MD at bedside. 

## 2015-11-02 NOTE — Anesthesia Procedure Notes (Signed)
Procedure Name: Intubation Performed by: Paytience Bures Pre-anesthesia Checklist: Patient identified, Patient being monitored, Timeout performed, Emergency Drugs available and Suction available Patient Re-evaluated:Patient Re-evaluated prior to inductionOxygen Delivery Method: Circle system utilized Preoxygenation: Pre-oxygenation with 100% oxygen Intubation Type: IV induction Ventilation: Mask ventilation without difficulty Laryngoscope Size: Mac and 3 Grade View: Grade I Tube type: Oral Tube size: 7.0 mm Number of attempts: 1 Airway Equipment and Method: Stylet Placement Confirmation: ETT inserted through vocal cords under direct vision,  positive ETCO2 and breath sounds checked- equal and bilateral Secured at: 21 cm Tube secured with: Tape Dental Injury: Teeth and Oropharynx as per pre-operative assessment        

## 2015-11-02 NOTE — H&P (Signed)
Patient ID: Brooke Pearson, female   DOB: 25-Jun-1985, 30 y.o.   MRN: [id number]  CC: ABDOMINAL PAIN  HPI Brooke Pearson is a 30 y.o. female who presents to emergency department with a 2 day history of abdominal pain. Patient reports that she started feeling ill Friday evening with nausea. Then Friday night into Saturday morning she started having lower abdominal pain. Throughout the day on Saturday the pain gradually moved to her right lower quadrant and started to worsen. Upon awakening Sunday she went to a walk-in clinic who then sent her to the emergency department. She reports never had anything like this before. She did have an upper respiratory infection approximately 2 weeks ago but has resolved. The pain has remained in the right lower quadrant she's had subjective nausea but no vomiting and she's had subjective chills. Her last bowel movement was earlier today. She denies any chest pain, shortness of breath, constipation, diarrhea. She does have a significant history of bariatric surgery and has lost over 160 pounds.  HPI  Past Medical History:  Diagnosis Date  . Asthma     Past Surgical History:  Procedure Laterality Date  . ABDOMINAL SURGERY  aug 2015   gastric bypass  . CHOLECYSTECTOMY    . GASTRIC BYPASS  aug 2015  . HERNIA REPAIR  aug 2015    Family history: Son with type 1 diabetes, grandparents with Type 2 diabetes, grandfather with lymphoma and extended family with heart disease.  Social History Social History  Substance Use Topics  . Smoking status: Never Smoker  . Smokeless tobacco: Never Used  . Alcohol use Yes     Comment: social    No Known Allergies  Current Facility-Administered Medications  Medication Dose Route Frequency Provider Last Rate Last Dose  . morphine 4 MG/ML injection           . ondansetron (ZOFRAN) 4 MG/2ML injection           . piperacillin-tazobactam (ZOSYN) IVPB 3.375 g  3.375 g Intravenous Q6H Ricarda Frame, MD       Current  Outpatient Prescriptions  Medication Sig Dispense Refill  . citalopram (CELEXA) 10 MG tablet Take 1 tablet by mouth daily.    Marland Kitchen etonogestrel-ethinyl estradiol (NUVARING) 0.12-0.015 MG/24HR vaginal ring Place vaginally.    . traZODone (DESYREL) 50 MG tablet Take 1 tablet by mouth at bedtime.       Review of Systems A Multi-point review of systems was asked and was negative except for the findings documented in the history of present illness   Physical Exam Blood pressure (!) 141/72, pulse 79, temperature 98.9 F (37.2 C), temperature source Oral, resp. rate 18, height 5\' 7"  (1.702 m), weight 98.4 kg (217 lb), last menstrual period 10/10/2015, SpO2 100 %. CONSTITUTIONAL: Resting in bed in no acute distress. EYES: Pupils are equal, round, and reactive to light, Sclera are non-icteric. EARS, NOSE, MOUTH AND THROAT: The oropharynx is clear. The oral mucosa is pink and moist. Hearing is intact to voice. LYMPH NODES:  Lymph nodes in the neck are normal. RESPIRATORY:  Lungs are clear. There is normal respiratory effort, with equal breath sounds bilaterally, and without pathologic use of accessory muscles. CARDIOVASCULAR: Heart is regular without murmurs, gallops, or rubs. GI: The abdomen is soft, tender to palpation in the right lower quadrant at McBurney's point with a positive Rovsing sign, and nondistended. There are no palpable masses. There is no hepatosplenomegaly. There are normal bowel sounds in all quadrants. GU: Rectal  deferred.   MUSCULOSKELETAL: Normal muscle strength and tone. No cyanosis or edema.   SKIN: Turgor is good and there are no pathologic skin lesions or ulcers. NEUROLOGIC: Motor and sensation is grossly normal. Cranial nerves are grossly intact. PSYCH:  Oriented to person, place and time. Affect is normal.  Data Reviewed Images and labs reviewed. Labs are mostly within normal limits but with a white count of 8.2, potassium of 3.4, mild anemia 9.3 over 28.6, evidence of  urinary tract infection with bacteria present and a few leukocytes. CT scan shows a thickening of the mid body of the appendix with some periappendiceal stranding. There is no evidence of free air or free fluid. She does have distorted anatomy from bariatric surgery. I have personally reviewed the patient's imaging, laboratory findings and medical records.    Assessment    Acute appendicitis    Plan    30 year old female with acute appendicitis. The procedure for laparoscopic appendectomy was described in detail to the patient. This included the risks, benefits, alternatives. She voiced understanding and desires to proceed. Discussed with her given that she had a large lunch at 12:30 that it would likely be later this evening before proceeding to surgery. This will be performed by my partner Dr. [name]. Patient voiced understanding. She'll proceed to surgery and then spent the night under observation.     Time spent with the patient was 60 minutes, with more than 50% of the time spent in face-to-face education, counseling and care coordination.     Ricarda Frameharles Kirston Luty, MD FACS General Surgeon 11/02/2015, 6:32 PM

## 2015-11-02 NOTE — Op Note (Signed)
Preoperative Diagnosis: 1) 30 y.o. with right adnexal mass Postoperative Diagnosis: 1) 30 y.o. torsion of right salpinx   Operation Performed: Laparoscopic right partial salpingectomy  Indication: 30 y.o. in OR for appendectomy by Dr. [name] and noted to have right adnexal mass for which an intraoperative gynecology consult was obtained  Surgeron/Intraoperative consult: Vena AustriaAndreas Aashi Derrington, MD  Anesthesia: General  Specimens Removed: Distal portion of right fallopian tube  Complications: none  Intraoperative Findings: There was a hemorrhagic necrotic 4cm mass in the right pelvis, distinct from the right ovary.  Tracing this back to the uterus it appeared to be a portion of the right fallopian tube.  On further inspection there was definitive torsion of the distal fimbriated portion of the right fallopian tube.  This was untwisted successfully but remained dusky and non-viable.  Onset of symptoms was 2 days prior to presentation.   Patient Condition: stable  Procedure in Detail:  I was called to evaluate the patient intraoperatively during her appendectomy to examine a right adnexal process.  See intraoperative findings above.  Given that the distal portion of tube was non-viable the patient was in need of right partial salpingectomy.  The proximal portion of tube was normal and approximately 4-5cm were left intact. The tube was grasped at the at the site of torsion, a 5mm harmonic scalpel was then used the excise the necrotic portio of tube between the IP ligament and the site of torsion.  The specimen was then removed using an endo catch bag.  The pedicle was hemostatic following excision.  Remainder of operative note per Dr. [name].

## 2015-11-02 NOTE — ED Provider Notes (Signed)
[facility] Emergency Department Provider Note   ____________________________________________    I have reviewed the triage vital signs and the nursing notes.   HISTORY  Chief Complaint Abdominal Pain     HPI Brooke Pearson is a 30 y.o. female who presents with complaints of abdominal pain. Patient reports the pain is primarily in the right lower quadrant. Several days ago she felt queasy and nauseous and gradually developed pain in her right lower quadrant. Currently she reports the pain is moderate to severe and sharp in nature. She reports any sort of movement sneezing or bouncing causes pain in the area. She denies fever or chills. She has a history of weight loss surgery and cholecystectomy   Past Medical History:  Diagnosis Date  . Asthma     There are no active problems to display for this patient.   Past Surgical History:  Procedure Laterality Date  . ABDOMINAL SURGERY  aug 2015   gastric bypass  . CHOLECYSTECTOMY    . GASTRIC BYPASS  aug 2015  . HERNIA REPAIR  aug 2015    Prior to Admission medications   Medication Sig Start Date End Date Taking? Authorizing Provider  citalopram (CELEXA) 10 MG tablet Take 1 tablet by mouth daily. 11/29/14 11/29/15  Historical Provider, MD  etonogestrel-ethinyl estradiol (NUVARING) 0.12-0.015 MG/24HR vaginal ring Place vaginally. 12/09/14   Historical Provider, MD  traZODone (DESYREL) 50 MG tablet Take 1 tablet by mouth at bedtime. 04/17/15 04/16/16  Historical Provider, MD     Allergies Review of patient's allergies indicates no known allergies.  No family history on file.  Social History Social History  Substance Use Topics  . Smoking status: Never Smoker  . Smokeless tobacco: Never Used  . Alcohol use Yes     Comment: social    Review of Systems  Constitutional: No fever/chills Eyes: No visual changes.  ENT: No sore throat. Cardiovascular: Denies chest pain. Respiratory: Denies  shortness of breath. Gastrointestinal:As above Genitourinary: Negative for dysuria. Musculoskeletal: Negative for back pain. Skin: Negative for rash. Neurological: Negative for headaches   10-point ROS otherwise negative.  ____________________________________________   PHYSICAL EXAM:  VITAL SIGNS: ED Triage Vitals [11/02/15 1408]  Enc Vitals Group     BP (!) 141/72     Pulse Rate 79     Resp 18     Temp 98.9 F (37.2 C)     Temp Source Oral     SpO2 100 %     Weight 217 lb (98.4 kg)     Height 5\' 7"  (1.702 m)     Head Circumference      Peak Flow      Pain Score 8     Pain Loc      Pain Edu?      Excl. in GC?     Constitutional: Alert and oriented. No acute distress. Pleasant and interactive Eyes: Conjunctivae are normal.  Head: Atraumatic. Nose: No congestion/rhinnorhea. Mouth/Throat: Mucous membranes are moist.    Cardiovascular: Normal rate, regular rhythm. Grossly normal heart sounds.  Good peripheral circulation. Respiratory: Normal respiratory effort.  No retractions. Lungs CTAB. Gastrointestinal: Moderate tenderness in the right lower quadrant, positive rebound tenderness. No distention.  No CVA tenderness. Genitourinary: deferred Musculoskeletal: No lower extremity tenderness nor edema.  Warm and well perfused Neurologic:  Normal speech and language. No gross focal neurologic deficits are appreciated.  Skin:  Skin is warm, dry and intact. No rash noted. Psychiatric: Mood and affect  are normal. Speech and behavior are normal.  ____________________________________________   LABS (all labs ordered are listed, but only abnormal results are displayed)  Labs Reviewed  COMPREHENSIVE METABOLIC PANEL - Abnormal; Notable for the following:       Result Value   Potassium 3.4 (*)    Calcium 8.7 (*)    Anion gap 4 (*)    All other components within normal limits  CBC - Abnormal; Notable for the following:    Hemoglobin 9.3 (*)    HCT 28.6 (*)    MCV 68.1 (*)     MCH 22.2 (*)    RDW 18.6 (*)    All other components within normal limits  URINALYSIS COMPLETEWITH MICROSCOPIC ([facility] ONLY) - Abnormal; Notable for the following:    Color, Urine YELLOW (*)    APPearance HAZY (*)    Leukocytes, UA 2+ (*)    Bacteria, UA RARE (*)    Squamous Epithelial / LPF 0-5 (*)    All other components within normal limits  LIPASE, BLOOD  POC URINE PREG, ED  POCT PREGNANCY, URINE   ____________________________________________  EKG  None ____________________________________________  RADIOLOGY  CT scan consistent with acute appendicitis ____________________________________________   PROCEDURES  Procedure(s) performed: No    Critical Care performed: No ____________________________________________   INITIAL IMPRESSION / ASSESSMENT AND PLAN / ED COURSE  Pertinent labs & imaging results that were available during my care of the patient were reviewed by me and considered in my medical decision making (see chart for details).  She presents with right lower quadrant tenderness suspicious for appendicitis. History of present illness is consistent with diagnosis, we'll obtain CT scan, and give IV morphine and IV Zofran for pain.   ----------------------------------------- 5:50 PM on 11/02/2015 -----------------------------------------  CT demonstrates likely acute appendicitis no evidence of rupture, discussed with Dr. Tonita CongWoodham who will see the patient  ____________________________________________   FINAL CLINICAL IMPRESSION(S) / ED DIAGNOSES  Final diagnoses:  Acute appendicitis with localized peritonitis      NEW MEDICATIONS STARTED DURING THIS VISIT:  New Prescriptions   No medications on file     Note:  This document was prepared using Dragon voice recognition software and may include unintentional dictation errors.    Jene Everyobert Drayton Tieu, MD 11/02/15 [phone]

## 2015-11-02 NOTE — Op Note (Signed)
11/02/2015  9:42 PM  PATIENT:  Brooke Pearson  30 y.o. female  PRE-OPERATIVE DIAGNOSIS:  acute appendicitis  POST-OPERATIVE DIAGNOSIS:  right fallopian tube torsion  PROCEDURE:  Procedure(s) with comments: APPENDECTOMY LAPAROSCOPIC (N/A) LAPAROSCOPIC RIGHT PARTIAL SALPINGECTOMY (Right) - Performed by Dr. Bonney AidStaebler   SURGEON:  Surgeon(s) and Role:    * Tiney Rougealph Ely III, MD - Primary    * Vena AustriaAndreas Staebler, MD - Assisting   ASSISTANTS: none   ANESTHESIA:   general  EBL:  Total I/O In: 1000 [I.V.:1000] Out: 50 [Blood:50]   DRAINS: none   LOCAL MEDICATIONS USED:  MARCAINE      DISPOSITION OF SPECIMEN:  PATHOLOGY   DICTATION: .Dragon Dictation with the patient supine position and after induction of appropriate general anesthesia the patient's abdomen was prepped ChloraPrep and draped sterile towels. Patient placed headdown feet up position. Small infraumbilical incision was made standard fashion carried down bluntly through subcutaneous tissue. A varies needle was used cannulate peritoneal cavity. CO2 was insufflated to appropriate pressure measurements.  When approximately 2 L of CO2 were instilled a varies needle was withdrawn and 11 mm trocar inserted under direct vision using the Optiview apparatus. Intrapartum position was confirmed and CO2 was reinsufflated. The right lower quadrant transverse incision was made and an 11 mm port inserted under direct vision. The abdomen was then examined.  On immediate inspection of the pelvis there was a large hemorrhagic mass in the right adnexa. The right ovary was visualized as was a portion of the tube. Working diagnosis was a possible tubal portion. The appendix was identified. We slightly thickened but there was no evidence of significant inflammatory reaction around it and no evidence of acute appendicitis. The GYN surgery service was consulted intraoperatively and Dr.Staebler responded to our inquiry. His portion of the operation will  be dictated separately.  A suprapubic transverse incision was made and a 12 mm port inserted under direct vision. The base of the appendix was identified and a window created at the base. The base was divided with single application of the Endo GIA stapling device carrying a blue load. Mesoappendix was dissected free and divided using the ultrasonic harmonic scalpel. Appendix was captured Endo Catch apparatus removed without difficulty. The abdomen was then copiously irrigated and suctioned.  The lower midline incision was closed with figure-of-eight suture of 0 Vicryl using the suture passer. The abdomen was desufflated. All peripheral without difficulty. Skin incisions were closed with 5-0 nylon. Wounds were dressed sterilely after infiltrating them with 0.25% Marcaine for postoperative pain control. Patient was returned to recovery room in satisfactory condition. Sponge estimate needle count correct 2 in the operating room.  PLAN OF CARE: Admit for overnight observation  PATIENT DISPOSITION:  PACU - hemodynamically stable.   Tiney Rougealph Ely III, MD

## 2015-11-02 NOTE — Anesthesia Preprocedure Evaluation (Signed)
Anesthesia Evaluation  Patient identified by MRN, date of birth, ID band Patient awake    Reviewed: Allergy & Precautions, H&P , NPO status , Patient's Chart, lab work & pertinent test results  History of Anesthesia Complications Negative for: history of anesthetic complications  Airway Mallampati: I  TM Distance: >3 FB Neck ROM: full    Dental no notable dental hx. (+) Poor Dentition, Chipped, Loose   Pulmonary neg shortness of breath, asthma ,    Pulmonary exam normal breath sounds clear to auscultation       Cardiovascular Exercise Tolerance: Good (-) angina(-) Past MI and (-) DOE negative cardio ROS Normal cardiovascular examVentricular Fibrillation  Rhythm:regular Rate:Normal     Neuro/Psych negative neurological ROS  negative psych ROS   GI/Hepatic negative GI ROS, Neg liver ROS, neg GERD  ,  Endo/Other  negative endocrine ROS  Renal/GU      Musculoskeletal   Abdominal   Peds  Hematology negative hematology ROS (+)   Anesthesia Other Findings Past Medical History: No date: Asthma  Past Surgical History: aug 2015: ABDOMINAL SURGERY     Comment: gastric bypass No date: CHOLECYSTECTOMY aug 2015: GASTRIC BYPASS aug 2015: HERNIA REPAIR  BMI    Body Mass Index:  33.99 kg/m      Reproductive/Obstetrics negative OB ROS                             Anesthesia Physical Anesthesia Plan  ASA: III  Anesthesia Plan: General ETT   Post-op Pain Management:    Induction:   Airway Management Planned:   Additional Equipment:   Intra-op Plan:   Post-operative Plan:   Informed Consent: I have reviewed the patients History and Physical, chart, labs and discussed the procedure including the risks, benefits and alternatives for the proposed anesthesia with the patient or authorized representative who has indicated his/her understanding and acceptance.     Plan Discussed with:  Anesthesiologist, CRNA and Surgeon  Anesthesia Plan Comments:         Anesthesia Quick Evaluation

## 2015-11-02 NOTE — Transfer of Care (Signed)
Immediate Anesthesia Transfer of Care Note  Patient: Brooke Pearson  Procedure(s) Performed: Procedure(s) with comments: APPENDECTOMY LAPAROSCOPIC (N/A) LAPAROSCOPIC RIGHT PARTIAL SALPINGECTOMY (Right) - Performed by Dr. Bonney AidStaebler   Patient Location: PACU  Anesthesia Type:General  Level of Consciousness: awake, alert  and oriented  Airway & Oxygen Therapy: Patient Spontanous Breathing and Patient connected to face mask oxygen  Post-op Assessment: Report given to RN and Post -op Vital signs reviewed and stable  Post vital signs: Reviewed and stable  Last Vitals:  Vitals:   11/02/15 1408  BP: (!) 141/72  Pulse: 79  Resp: 18  Temp: 37.2 C    Last Pain:  Vitals:   11/02/15 1413  TempSrc:   PainSc: 2          Complications: No apparent anesthesia complications

## 2015-11-02 NOTE — ED Triage Notes (Signed)
Lower abdominal and RLQ pain since Friday. Also reporting right sided flank pain, worse with movement. Pt alert and oriented X4, active, cooperative, pt in NAD. RR even and unlabored, color WNL.

## 2015-11-03 ENCOUNTER — Encounter: Payer: Self-pay | Admitting: Surgery

## 2015-11-03 LAB — CBC
HEMATOCRIT: 27.6 % — AB (ref 35.0–47.0)
HEMOGLOBIN: 8.9 g/dL — AB (ref 12.0–16.0)
MCH: 21.9 pg — AB (ref 26.0–34.0)
MCHC: 32.1 g/dL (ref 32.0–36.0)
MCV: 68.1 fL — AB (ref 80.0–100.0)
Platelets: 200 10*3/uL (ref 150–440)
RBC: 4.06 MIL/uL (ref 3.80–5.20)
RDW: 18.5 % — ABNORMAL HIGH (ref 11.5–14.5)
WBC: 7.9 10*3/uL (ref 3.6–11.0)

## 2015-11-03 MED ORDER — TRAMADOL HCL 50 MG PO TABS
100.0000 mg | ORAL_TABLET | Freq: Four times a day (QID) | ORAL | Status: DC | PRN
  Administered 2015-11-03 – 2015-11-04 (×4): 100 mg via ORAL
  Filled 2015-11-03 (×4): qty 2

## 2015-11-03 MED ORDER — TRAMADOL HCL 50 MG PO TABS: 100.0000 mg | ORAL_TABLET | Freq: Four times a day (QID) | ORAL | 0 refills | Status: DC | PRN

## 2015-11-03 NOTE — Progress Notes (Signed)
Patient remains nauseated has been tolerating a diet but vomited one more time this afternoon therefore we'll keep the patient overnight.

## 2015-11-03 NOTE — Discharge Instructions (Signed)
Laparoscopic Appendectomy  °Care After  ° ° °These instructions give you information on caring for yourself after your procedure. Your doctor may also give you more specific instructions. Call your doctor if you have any problems or questions after your procedure.  °HOME CARE  °Do not drive while taking pain medicine (narcotics).  °Take medicine (stool softener) if you cannot poop (constipated).  °Change your bandages (dressings) as told by your doctor.  °Keep your wounds clean and dry. Wash the wounds gently with soap and water. Gently pat the wounds dry with a clean towel.  °Do not take baths, swim, or use hot tubs for 10 days, or as told by your doctor.  °Only take medicine as told by your doctor.  °Continue your normal diet as told by your doctor.  °Do not lift more than 10 pounds (4.5 kilograms) or play contact sports for 3 weeks, or as told by your doctor.  °Slowly increase your activity.  °Take deep breaths to avoid a lung infection (pneumonia). °GET HELP RIGHT AWAY IF:  °You have a fever >101 °You have a rash.  °You have trouble breathing or sharp chest pain.  °You have a reaction to the medicine you are taking.  °Your wound is red, puffy (swollen), or painful.  °You have yellowish-white fluid (pus) coming from the wound.  °You have fluid coming from the wound for longer than 1 day.  °You notice a bad smell coming from the wound or bandage.  °Your wound breaks open after stitches (sutures) or staples are removed.  °You have pain in the shoulders or shoulder blades.   °You are short of breath.  °You feel sick to your stomach (nauseous) or throw up (vomit).  °MAKE SURE YOU:  °Understand these instructions.  °Will watch your condition.  °Will get help right away if you are not doing well or get worse. ° °

## 2015-11-03 NOTE — Progress Notes (Signed)
Discussed the patient with Dr. [name]. Complicated appendectomy with salpingectomy. Patient experiencing considerable nausea and vomiting this morning possibly related to Vicodin. Will see her later on today and possibly discharge of her nausea is better controlled.

## 2015-11-03 NOTE — Progress Notes (Signed)
Subjective:   We'll better this morning with less abdominal pain. She did have some significant nausea with her pain medication. She did vomit. She has voided without difficulty.  Vital signs in last 24 hours: Temp:  [97.6 F (36.4 C)-98.9 F (37.2 C)] 98 F (36.7 C) (09/11 0443) Pulse Rate:  [65-81] 65 (09/11 0443) Resp:  [14-22] 22 (09/11 0443) BP: (115-141)/(60-81) 119/60 (09/11 0443) SpO2:  [95 %-100 %] 98 % (09/11 0443) Weight:  [98.4 kg (217 lb)] 98.4 kg (217 lb) (09/10 1408) Last BM Date: 11/01/15  Intake/Output from previous day: 09/10 0701 - 09/11 0700 In: 1601.3 [I.V.:1601.3] Out: 700 [Urine:650; Blood:50]  Exam:  Abdomen is soft with incisional tenderness and minimal abdominal wall drainage.  Lab Results:  CBC  Recent Labs  11/02/15 1414 11/03/15 0533  WBC 8.2 7.9  HGB 9.3* 8.9*  HCT 28.6* 27.6*  PLT 227 200   CMP     Component Value Date/Time   NA 139 11/02/2015 1414   NA 140 06/26/2013 1019   K 3.4 (L) 11/02/2015 1414   K 3.9 06/26/2013 1019   CL 111 11/02/2015 1414   CL 107 06/26/2013 1019   CO2 24 11/02/2015 1414   CO2 28 06/26/2013 1019   GLUCOSE 91 11/02/2015 1414   GLUCOSE 101 (H) 06/26/2013 1019   BUN 10 11/02/2015 1414   BUN 13 06/26/2013 1019   CREATININE 0.58 11/02/2015 1414   CREATININE 0.73 06/26/2013 1019   CALCIUM 8.7 (L) 11/02/2015 1414   CALCIUM 8.7 06/26/2013 1019   PROT 7.0 11/02/2015 1414   PROT 6.9 06/26/2013 1019   ALBUMIN 3.7 11/02/2015 1414   ALBUMIN 3.4 06/26/2013 1019   AST 15 11/02/2015 1414   AST 19 06/26/2013 1019   ALT 18 11/02/2015 1414   ALT 23 06/26/2013 1019   ALKPHOS 105 11/02/2015 1414   ALKPHOS 84 06/26/2013 1019   BILITOT 0.5 11/02/2015 1414   BILITOT 0.3 06/26/2013 1019   GFRNONAA >60 11/02/2015 1414   GFRNONAA >60 06/26/2013 1019   GFRAA >60 11/02/2015 1414   GFRAA >60 06/26/2013 1019   PT/INR No results for input(s): LABPROT, INR in the last 72 hours.  Studies/Results: Ct Abdomen Pelvis W  Contrast  Result Date: 11/02/2015 CLINICAL DATA:  Right lower quadrant pain, nausea for 3 days EXAM: CT ABDOMEN AND PELVIS WITH CONTRAST TECHNIQUE: Multidetector CT imaging of the abdomen and pelvis was performed using the standard protocol following bolus administration of intravenous contrast. CONTRAST:  100mL ISOVUE-300 IOPAMIDOL (ISOVUE-300) INJECTION 61% COMPARISON:  None. FINDINGS: Lower chest: No acute abnormality. Hepatobiliary: No focal liver abnormality is seen. Status post cholecystectomy. No biliary dilatation. Pancreas: Unremarkable. No pancreatic ductal dilatation or surrounding inflammatory changes. Spleen: Normal in size without focal abnormality. Adrenals/Urinary Tract: Adrenal glands are unremarkable. Kidneys are normal, without renal calculi, focal lesion, or hydronephrosis. Bladder is unremarkable. Stomach/Bowel: No bowel dilatation to suggest obstruction. Moderate amount of stool in the ascending colon. No pneumatosis, pneumoperitoneum or portal venous gas. Postsurgical changes from prior gastric bypass. Mild wall thickening of the distal portion of the appendix with periappendiceal inflammatory changes most concerning for appendicitis. Vascular/Lymphatic: Normal caliber abdominal aorta. No lymphadenopathy. Reproductive: Normal uterus. 4.8 cm hypodense, fluid attenuating right adnexal mass most consistent with a cyst. Small amount of pelvic free fluid. Other: No fluid collection or hematoma. Musculoskeletal: No acute osseous abnormality. No lytic or sclerotic osseous lesion. IMPRESSION: 1. Wall thickening of the distal aspect of the appendix with periappendiceal inflammatory changes most consistent with  acute appendicitis. Electronically Signed   By: Elige Ko   On: 11/02/2015 17:24    Assessment/Plan: Overall she's doing well. We will increase her activity level and advance her diet and consider discharge later today. Our gynecology colleagues did discuss the implications of the  surgery with her in detail. She expresses good understanding.

## 2015-11-04 LAB — SURGICAL PATHOLOGY

## 2015-11-04 NOTE — Discharge Summary (Signed)
Physician Discharge Summary  Patient ID: Brooke Pearson MRN: [id number] DOB/AGE: [birth year] 30 y.o.  Admit date: 11/02/2015 Discharge date: 11/04/2015   Discharge Diagnoses:  Active Problems:   Appendicitis   Acute appendicitis   Torsion of fallopian tube   Procedures:Diagnostic laparoscopy laparoscopic appendectomy salpingectomy  Hospital Course: This a patient admitted the hospital with possible appendicitis and a diagnostic laparoscopy was performed by Dr. [name] at which time he found a fairly normal-appearing appendix but an abnormal appearing portion of the fallopian tube. Area the appendix was removed and an intraoperative consultation was obtained and the decision to remove the fallopian tube was performed a portion of fallopian tube was removed. Patient made a non-, located postoperative recovery she did have some nausea which delayed her discharge currently she is taking tramadol for pain as needed and feeling well rate she will be discharged from the hospital today to follow-up in our office in 10 days. Pathology revealed be reviewed at that time.  Consults: Intraoperative consult with GYN  [facility]  Discharge Instructions    Diet - low sodium heart healthy    Complete by:  As directed   Increase activity slowly    Complete by:  As directed       Medication List    TAKE these medications   albuterol 108 (90 Base) MCG/ACT inhaler Commonly known as:  PROVENTIL HFA;VENTOLIN HFA Inhale 2 puffs into the lungs every 4 (four) hours as needed for wheezing or shortness of breath.   albuterol (2.5 MG/3ML) 0.083% nebulizer solution Commonly known as:  PROVENTIL Take 2.5 mg by nebulization every 6 (six) hours as needed for wheezing.   MULTI-VITAMINS Tabs Take 1 tablet by mouth daily.   traMADol 50 MG tablet Commonly known as:  ULTRAM Take 2 tablets (100 mg total) by mouth every 6 (six) hours as needed for moderate pain.      Follow-up  Information    STAEBLER, Florina OuANDREAS M, MD Follow up in 6 week(s).   Specialty:  Obstetrics and Gynecology Why:  postop Contact information: [address] [place] [address] [phone]        [facility] Follow up in 1 week(s).   Contact information: [address] [place] [id number] [phone]          Lattie Hawichard E Legacy Lacivita, MD, FACS

## 2015-11-04 NOTE — Anesthesia Postprocedure Evaluation (Signed)
Anesthesia Post Note  Patient: Brooke Pearson  Procedure(s) Performed: Procedure(s) (LRB): APPENDECTOMY LAPAROSCOPIC (N/A) LAPAROSCOPIC RIGHT PARTIAL SALPINGECTOMY (Right)  Patient location during evaluation: PACU Anesthesia Type: General Level of consciousness: awake and alert Pain management: pain level controlled Vital Signs Assessment: post-procedure vital signs reviewed and stable Respiratory status: spontaneous breathing, nonlabored ventilation, respiratory function stable and patient connected to nasal cannula oxygen Cardiovascular status: blood pressure returned to baseline and stable Postop Assessment: no signs of nausea or vomiting Anesthetic complications: no    Last Vitals:  Vitals:   11/04/15 0524 11/04/15 1100  BP: (!) 102/49 110/62  Pulse: 62 67  Resp: 20 18  Temp: 36.7 C 36.7 C    Last Pain:  Vitals:   11/04/15 1100  TempSrc: Oral  PainSc:                  Cleda MccreedyJoseph K Piscitello

## 2015-11-04 NOTE — Progress Notes (Signed)
2 Days Post-Op  Subjective: She feels much better today no further nausea or vomiting tolerating a regular diet  Objective: Vital signs in last 24 hours: Temp:  [97.7 F (36.5 C)-98.2 F (36.8 C)] 98 F (36.7 C) (09/12 0524) Pulse Rate:  [60-73] 62 (09/12 0524) Resp:  [16-20] 20 (09/12 0524) BP: (102-131)/(49-67) 102/49 (09/12 0524) SpO2:  [99 %-100 %] 100 % (09/12 0524) Last BM Date: 11/02/15  Intake/Output from previous day: 09/11 0701 - 09/12 0700 In: 1796 [I.V.:1796] Out: 650 [Urine:650] Intake/Output this shift: No intake/output data recorded.  Physical exam:  Abdomen is soft nontender wounds are clean with dressings.  Lab Results: CBC   Recent Labs  11/02/15 1414 11/03/15 0533  WBC 8.2 7.9  HGB 9.3* 8.9*  HCT 28.6* 27.6*  PLT 227 200   BMET  Recent Labs  11/02/15 1414  NA 139  K 3.4*  CL 111  CO2 24  GLUCOSE 91  BUN 10  CREATININE 0.58  CALCIUM 8.7*   PT/INR No results for input(s): LABPROT, INR in the last 72 hours. ABG No results for input(s): PHART, HCO3 in the last 72 hours.  Invalid input(s): PCO2, PO2  Studies/Results: Ct Abdomen Pelvis W Contrast  Result Date: 11/02/2015 CLINICAL DATA:  Right lower quadrant pain, nausea for 3 days EXAM: CT ABDOMEN AND PELVIS WITH CONTRAST TECHNIQUE: Multidetector CT imaging of the abdomen and pelvis was performed using the standard protocol following bolus administration of intravenous contrast. CONTRAST:  100mL ISOVUE-300 IOPAMIDOL (ISOVUE-300) INJECTION 61% COMPARISON:  None. FINDINGS: Lower chest: No acute abnormality. Hepatobiliary: No focal liver abnormality is seen. Status post cholecystectomy. No biliary dilatation. Pancreas: Unremarkable. No pancreatic ductal dilatation or surrounding inflammatory changes. Spleen: Normal in size without focal abnormality. Adrenals/Urinary Tract: Adrenal glands are unremarkable. Kidneys are normal, without renal calculi, focal lesion, or hydronephrosis. Bladder is  unremarkable. Stomach/Bowel: No bowel dilatation to suggest obstruction. Moderate amount of stool in the ascending colon. No pneumatosis, pneumoperitoneum or portal venous gas. Postsurgical changes from prior gastric bypass. Mild wall thickening of the distal portion of the appendix with periappendiceal inflammatory changes most concerning for appendicitis. Vascular/Lymphatic: Normal caliber abdominal aorta. No lymphadenopathy. Reproductive: Normal uterus. 4.8 cm hypodense, fluid attenuating right adnexal mass most consistent with a cyst. Small amount of pelvic free fluid. Other: No fluid collection or hematoma. Musculoskeletal: No acute osseous abnormality. No lytic or sclerotic osseous lesion. IMPRESSION: 1. Wall thickening of the distal aspect of the appendix with periappendiceal inflammatory changes most consistent with acute appendicitis. Electronically Signed   By: Elige KoHetal  Patel   On: 11/02/2015 17:24    Anti-infectives: Anti-infectives    Start     Dose/Rate Route Frequency Ordered Stop   11/03/15 0200  piperacillin-tazobactam (ZOSYN) IVPB 3.375 g  Status:  Discontinued     3.375 g 12.5 mL/hr over 240 Minutes Intravenous Every 8 hours 11/02/15 1839 11/02/15 2141   11/02/15 1845  piperacillin-tazobactam (ZOSYN) IVPB 3.375 g     3.375 g 100 mL/hr over 30 Minutes Intravenous  Once 11/02/15 1832 11/02/15 1921      Assessment/Plan: s/p Procedure(s): APPENDECTOMY LAPAROSCOPIC LAPAROSCOPIC RIGHT PARTIAL SALPINGECTOMY   Nausea vomiting is resolved patient feels better Wilson probably discharge her after breakfast if she continues to tolerate diet. She'll follow-up next week.  Lattie Hawichard E Anakaren Campion, MD, FACS  11/04/2015

## 2015-11-04 NOTE — Progress Notes (Signed)
Patient tolerated her diet this morning and has had no further nausea or vomiting feels well and wants to go home. Her graft will discharge on tramadol when necessary and follow-up in one week

## 2015-11-04 NOTE — Progress Notes (Signed)
Patient discharged to home. IV site removed. Discharge information gone over by charge RN

## 2015-11-12 ENCOUNTER — Ambulatory Visit (INDEPENDENT_AMBULATORY_CARE_PROVIDER_SITE_OTHER): Payer: PRIVATE HEALTH INSURANCE | Admitting: Surgery

## 2015-11-12 ENCOUNTER — Encounter: Payer: Self-pay | Admitting: Surgery

## 2015-11-12 VITALS — BP 133/67 | HR 78 | Temp 98.1°F | Wt 211.0 lb

## 2015-11-12 DIAGNOSIS — Z9049 Acquired absence of other specified parts of digestive tract: Secondary | ICD-10-CM

## 2015-11-12 DIAGNOSIS — N83529 Torsion of fallopian tube, unspecified side: Secondary | ICD-10-CM

## 2015-11-12 NOTE — Progress Notes (Signed)
30 year old female who underwent a diagnostic laparoscopy which revealed a right fallopian tube torsion. Dr. [name] remove the appendix at that time and Dr. Chauncey CruelStabler remove part of the fallopian tube. Of note the patient is now pregnant. The patient states that her pain is doing well she only has some slight tenderness in the right lower quadrant with certain movements. The patient states that she still does not have a great appetite and sometimes gets nauseated particular in the morning but unsure if this is related to her pregnancy or postoperative period. The wounds have no drainage she's not had any fevers or chills and she is having good bowel movements.  Vitals:   11/12/15 1357  BP: 133/67  Pulse: 78  Temp: 98.1 F (36.7 C)   PE:  Gen: NAD Abd: soft, incisions c/d/i with stitches removed and steri strips placed  A/P:  Patient is healing well after having a diagnostic laparoscopy with appendectomy and partial removal of right fallopian tube due to torsion.  Stitches were removed and Steri-Strips placed. Patient was given a work excuse note to return in 3 weeks and will have her follow-up with Dr. Chauncey CruelStabler. She also has a follow-up with her regular OB in the next week. To call with any questions or concerns

## 2015-11-12 NOTE — Patient Instructions (Addendum)
We have arranged for you to see Dr.Staebler on 12/01/15 @ 8:20 am. We have removed your stitched today. We have provided you a return to work note today.  Please call our office with any questions or concerns.  Please do not submerge in a tub, hot tub, or pool until incisions are completely sealed.  Use sun block to incision area over the next year if this area will be exposed to sun. This helps decrease scarring.  You may now resume your normal activities. Listen to your body when lifting, if you have pain when lifting, stop and then try again in a few days.  If you develop redness, drainage, or pain at incision sites- call our office immediately and speak with a nurse.

## 2015-11-14 ENCOUNTER — Emergency Department: Payer: PRIVATE HEALTH INSURANCE

## 2015-11-14 ENCOUNTER — Emergency Department
Admission: EM | Admit: 2015-11-14 | Discharge: 2015-11-15 | Disposition: A | Discharge status: Home / Self Care | Payer: PRIVATE HEALTH INSURANCE | Attending: Emergency Medicine | Admitting: Emergency Medicine

## 2015-11-14 DIAGNOSIS — J45909 Unspecified asthma, uncomplicated: Secondary | ICD-10-CM | POA: Diagnosis not present

## 2015-11-14 DIAGNOSIS — R1031 Right lower quadrant pain: Secondary | ICD-10-CM

## 2015-11-14 DIAGNOSIS — R102 Pelvic and perineal pain: Secondary | ICD-10-CM | POA: Diagnosis not present

## 2015-11-14 DIAGNOSIS — G8918 Other acute postprocedural pain: Secondary | ICD-10-CM | POA: Diagnosis not present

## 2015-11-14 DIAGNOSIS — Z79899 Other long term (current) drug therapy: Secondary | ICD-10-CM | POA: Insufficient documentation

## 2015-11-14 DIAGNOSIS — O9989 Other specified diseases and conditions complicating pregnancy, childbirth and the puerperium: Secondary | ICD-10-CM | POA: Diagnosis present

## 2015-11-14 DIAGNOSIS — Z3490 Encounter for supervision of normal pregnancy, unspecified, unspecified trimester: Secondary | ICD-10-CM

## 2015-11-14 DIAGNOSIS — Z3A00 Weeks of gestation of pregnancy not specified: Secondary | ICD-10-CM | POA: Insufficient documentation

## 2015-11-14 LAB — URINALYSIS COMPLETE WITH MICROSCOPIC (ARMC ONLY)
BILIRUBIN URINE: NEGATIVE
GLUCOSE, UA: NEGATIVE mg/dL
HGB URINE DIPSTICK: NEGATIVE
KETONES UR: NEGATIVE mg/dL
LEUKOCYTES UA: NEGATIVE
NITRITE: NEGATIVE
PH: 5 (ref 5.0–8.0)
Protein, ur: NEGATIVE mg/dL
SPECIFIC GRAVITY, URINE: 1.018 (ref 1.005–1.030)

## 2015-11-14 LAB — COMPREHENSIVE METABOLIC PANEL
ALT: 41 U/L (ref 14–54)
AST: 29 U/L (ref 15–41)
Albumin: 4 g/dL (ref 3.5–5.0)
Alkaline Phosphatase: 110 U/L (ref 38–126)
Anion gap: 5 (ref 5–15)
BILIRUBIN TOTAL: 0.6 mg/dL (ref 0.3–1.2)
BUN: 10 mg/dL (ref 6–20)
CO2: 24 mmol/L (ref 22–32)
CREATININE: 0.7 mg/dL (ref 0.44–1.00)
Calcium: 8.7 mg/dL — ABNORMAL LOW (ref 8.9–10.3)
Chloride: 110 mmol/L (ref 101–111)
Glucose, Bld: 92 mg/dL (ref 65–99)
POTASSIUM: 3.4 mmol/L — AB (ref 3.5–5.1)
Sodium: 139 mmol/L (ref 135–145)
TOTAL PROTEIN: 7.2 g/dL (ref 6.5–8.1)

## 2015-11-14 LAB — HCG, QUANTITATIVE, PREGNANCY: HCG, BETA CHAIN, QUANT, S: 706 m[IU]/mL — AB (ref <5)

## 2015-11-14 LAB — CBC WITH DIFFERENTIAL/PLATELET
BASOS PCT: 1 %
Basophils Absolute: 0 10*3/uL (ref 0–0.1)
EOS ABS: 0.2 10*3/uL (ref 0–0.7)
EOS PCT: 3 %
HCT: 29.6 % — ABNORMAL LOW (ref 35.0–47.0)
HEMOGLOBIN: 9.6 g/dL — AB (ref 12.0–16.0)
LYMPHS ABS: 2.5 10*3/uL (ref 1.0–3.6)
Lymphocytes Relative: 41 %
MCH: 22.3 pg — AB (ref 26.0–34.0)
MCHC: 32.5 g/dL (ref 32.0–36.0)
MCV: 68.7 fL — ABNORMAL LOW (ref 80.0–100.0)
MONO ABS: 0.4 10*3/uL (ref 0.2–0.9)
MONOS PCT: 7 %
NEUTROS PCT: 48 %
Neutro Abs: 3.1 10*3/uL (ref 1.4–6.5)
Platelets: 304 10*3/uL (ref 150–440)
RBC: 4.32 MIL/uL (ref 3.80–5.20)
RDW: 18.3 % — AB (ref 11.5–14.5)
WBC: 6.2 10*3/uL (ref 3.6–11.0)

## 2015-11-14 LAB — POCT PREGNANCY, URINE: Preg Test, Ur: POSITIVE — AB

## 2015-11-14 LAB — LIPASE, BLOOD: LIPASE: 34 U/L (ref 11–51)

## 2015-11-14 NOTE — ED Triage Notes (Signed)
Pt ambulatory to triage with no difficulty. Pt reports she had laparoscopic surgery on 11/02/15 for "inflamed appendix" and during the surgery she was found to have a twisted fallopian tube. This was repaired. Pt reports today she has developed worse abd pain to her right lower abd. Pt denies fever or redness or drainage from her surgery sites.

## 2015-11-14 NOTE — ED Provider Notes (Signed)
[facility] Emergency Department Provider Note   ____________________________________________   First MD Initiated Contact with Patient 11/14/15 2104     (approximate)  I have reviewed the triage vital signs and the nursing notes.   HISTORY  Chief Complaint Abdominal Pain and Post-op Problem   HPI Brooke Pearson is a 30 y.o. female who was seen on the 10th of this month. Patient had right lower quadrant pain. Patient was thought to have appendicitis. Patient turned out to have a twisted fallopian tube this was removed by Dr. Chauncey CruelStabler from [place]. Her appendix was also removed at the same time by Dr. Thayer OhmEaly. Patient was improving but for the last day or so has been complaining of increasing right lower quadrant pain similar to what she had before but not quite as severe. It is achy and pulls in the right lower quadrant worse with movement coughing and the bumps in the road. This is all the same as a previous episode of pain she had. In addition patient has a positive home pregnancy test.   Past Medical History:  Diagnosis Date  . Asthma     Patient Active Problem List   Diagnosis Date Noted  . Torsion of fallopian tube 11/02/2015  . 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 Well woman exam with routine gynecological exam 12/09/2014  . Asthma, mild intermittent, well-controlled 11/29/2014  . Morbid obesity (HCC) 09/25/2013    Past Surgical History:  Procedure Laterality Date  . ABDOMINAL SURGERY  aug 2015   gastric bypass  . CHOLECYSTECTOMY    . GASTRIC BYPASS  aug 2015  . HERNIA REPAIR  aug 2015  . LAPAROSCOPIC APPENDECTOMY N/A 11/02/2015   Procedure: APPENDECTOMY LAPAROSCOPIC;  Surgeon: Tiney Rougealph Ely III, MD;  Location: [facility] ORS;  Service: General;  Laterality: N/A;  . LAPAROSCOPIC UNILATERAL SALPINGECTOMY Right 11/02/2015   Procedure: LAPAROSCOPIC RIGHT PARTIAL SALPINGECTOMY;  Surgeon: Tiney Rougealph Ely III, MD;  Location: [facility] ORS;  Service: General;   Laterality: Right;  Performed by Dr. Bonney AidStaebler     Prior to Admission medications   Medication Sig Start Date End Date Taking? Authorizing Provider  albuterol (PROVENTIL HFA;VENTOLIN HFA) 108 (90 Base) MCG/ACT inhaler Inhale 2 puffs into the lungs every 4 (four) hours as needed for wheezing or shortness of breath.    Historical Provider, MD  Multiple Vitamin (MULTI-VITAMINS) TABS Take 1 tablet by mouth daily.    Historical Provider, MD    Allergies Butorphanol tartrate  No family history on file.  Social History Social History  Substance Use Topics  . Smoking status: Never Smoker  . Smokeless tobacco: Never Used  . Alcohol use Yes     Comment: social    Review of Systems Constitutional: No fever/chills Eyes: No visual changes. ENT: No sore throat. Cardiovascular: Denies chest pain. Respiratory: Denies shortness of breath. Gastrointestinal: See history of present illness. Genitourinary: Negative for dysuria. Musculoskeletal: Negative for back pain. Skin: Negative for rash. Neurological: Negative for headaches, focal weakness or numbness.  10-point ROS otherwise negative.  ____________________________________________   PHYSICAL EXAM:  VITAL SIGNS: ED Triage Vitals  Enc Vitals Group     BP 11/14/15 2048 (!) 146/97     Pulse Rate 11/14/15 2048 78     Resp 11/14/15 2048 18     Temp 11/14/15 2048 98.3 F (36.8 C)     Temp Source 11/14/15 2048 Oral     SpO2 11/14/15 2048 100 %     Weight 11/14/15 2049 206 lb (  93.4 kg)     Height 11/14/15 2049 5\' 7"  (1.702 m)     Head Circumference --      Peak Flow --      Pain Score 11/14/15 2050 5     Pain Loc --      Pain Edu? --      Excl. in GC? --     Constitutional: Alert and oriented. Well appearing and in no acute distress. Eyes: Conjunctivae are normal. PERRL. EOMI. Head: Atraumatic. Nose: No congestion/rhinnorhea. Mouth/Throat: Mucous membranes are moist.  Oropharynx non-erythematous. Neck: No stridor.    Cardiovascular: Normal rate, regular rhythm. Grossly normal heart sounds.  Good peripheral circulation. Respiratory: Normal respiratory effort.  No retractions. Lungs CTAB. Gastrointestinal: Soft but tender to right lower quadrant to palpation and percussion. Bowel sounds are decreased. Musculoskeletal: No lower extremity tenderness nor edema.  No joint effusions. Neurologic:  Normal speech and language. No gross focal neurologic deficits are appreciated. No gait instability. Skin:  Skin is warm, dry and intact. No rash noted. Psychiatric: Mood and affect are normal. Speech and behavior are normal.  ____________________________________________   LABS (all labs ordered are listed, but only abnormal results are displayed)  Labs Reviewed  URINALYSIS COMPLETEWITH MICROSCOPIC ([facility] ONLY) - Abnormal; Notable for the following:       Result Value   Color, Urine YELLOW (*)    APPearance CLEAR (*)    Bacteria, UA RARE (*)    Squamous Epithelial / LPF 0-5 (*)    All other components within normal limits  CBC WITH DIFFERENTIAL/PLATELET - Abnormal; Notable for the following:    Hemoglobin 9.6 (*)    HCT 29.6 (*)    MCV 68.7 (*)    MCH 22.3 (*)    RDW 18.3 (*)    All other components within normal limits  HCG, QUANTITATIVE, PREGNANCY - Abnormal; Notable for the following:    hCG, Beta Chain, Quant, S 706 (*)    All other components within normal limits  COMPREHENSIVE METABOLIC PANEL - Abnormal; Notable for the following:    Potassium 3.4 (*)    Calcium 8.7 (*)    All other components within normal limits  POCT PREGNANCY, URINE - Abnormal; Notable for the following:    Preg Test, Ur POSITIVE (*)    All other components within normal limits  C DIFFICILE QUICK SCREEN W PCR REFLEX  LIPASE, BLOOD  POC URINE PREG, ED   ____________________________________________  EKG   ____________________________________________  RADIOLOGY  Ultrasound is  pending. ____________________________________________   PROCEDURES  Procedure(s) performed:  Procedures  Critical Care performed:   ____________________________________________   INITIAL IMPRESSION / ASSESSMENT AND PLAN / ED COURSE  Pertinent labs & imaging results that were available during my care of the patient were reviewed by me and considered in my medical decision making (see chart for details).  Patient is signed out to Dr. Dolores Frame pending ultrasound report.  Clinical Course     ____________________________________________   FINAL CLINICAL IMPRESSION(S) / ED DIAGNOSES  Final diagnoses:  Right lower quadrant pain      NEW MEDICATIONS STARTED DURING THIS VISIT:  New Prescriptions   No medications on file     Note:  This document was prepared using Dragon voice recognition software and may include unintentional dictation errors.    Arnaldo Natal, MD 11/14/15 [id number]

## 2015-11-14 NOTE — ED Notes (Signed)
Pt in via triage; reports post op pain to right pelvic area x 1 day.  Pt with surgery on 9/10 in which her appendix was removed as well as part of right fallopian tube due to anatomical complications of tube.  Pt reports following all d/c instructions, with some positional post op pain since then, today pain became worse and constant.  Pt A/Ox4, ambulatory to room, no immediate distress at this time.

## 2015-11-14 NOTE — ED Notes (Signed)
Patient transported to Ultrasound 

## 2015-11-15 NOTE — ED Notes (Signed)
Pt. Verbalizes understanding of d/c instructions, and follow-up. VS stable and pain controlled per pt.  Pt. In NAD at time of d/c and denies further concerns regarding this visit. Pt. Stable at the time of departure from the unit, departing unit by the safest and most appropriate manner per that pt condition and limitations. Pt advised to return to the ED at any time for emergent concerns, or for new/worsening symptoms.   

## 2015-11-15 NOTE — Discharge Instructions (Signed)
1. Return to outpatient laboratory Monday morning for repeat pregnancy blood work. 2. Return to the ER for worsening symptoms, persistent vomiting, fever, vaginal bleeding or other concerns.

## 2015-11-15 NOTE — ED Provider Notes (Signed)
-----------------------------------------   12:45 AM on 11/15/2015 -----------------------------------------  OB ultrasound interpreted per Dr. Rito EhrlichKrishnan: No IUP is visualized.    By definition, this reflects a pregnancy of unknown location.  Differential considerations include early normal IUP, abnormal  IUP/missed abortion, or nonvisualized ectopic pregnancy.    Serial beta HCG is suggested, supplemented by repeat pelvic  ultrasound in 14 days (or earlier as clinically warranted).   Updated patient of ultrasound results. She is status post appendectomy with right fallopian tube removal, afebrile with normal white count and first trimester of pregnancy. No bleeding, nausea or vomiting. Will hold off on MRI. Patient will return for 48 hour beta hCG and will follow-up with GYN early next week. Strict return precautions given. Patient verbalizes understanding and agrees with plan of care.   Irean HongJade J Marcell Chavarin, MD 11/15/15 [id number]

## 2015-11-17 ENCOUNTER — Other Ambulatory Visit
Admission: RE | Admit: 2015-11-17 | Discharge: 2015-11-17 | Disposition: A | Discharge status: Home / Self Care | Payer: PRIVATE HEALTH INSURANCE | Source: Ambulatory Visit | Attending: Emergency Medicine | Admitting: Emergency Medicine

## 2015-11-17 DIAGNOSIS — R109 Unspecified abdominal pain: Secondary | ICD-10-CM | POA: Insufficient documentation

## 2015-11-17 DIAGNOSIS — Z3200 Encounter for pregnancy test, result unknown: Secondary | ICD-10-CM | POA: Insufficient documentation

## 2015-11-17 LAB — HCG, QUANTITATIVE, PREGNANCY: hCG, Beta Chain, Quant, S: 1680 m[IU]/mL — ABNORMAL HIGH (ref <5)

## 2015-11-18 ENCOUNTER — Telehealth: Payer: Self-pay

## 2015-11-18 NOTE — Telephone Encounter (Signed)
Patient's FMLA Forms were filled out and faxed to [phone] per Ileene PatrickMichelle LaPierre (patient's HR representative).

## 2015-12-09 DIAGNOSIS — O99213 Obesity complicating pregnancy, third trimester: Secondary | ICD-10-CM | POA: Diagnosis present

## 2015-12-11 DIAGNOSIS — D539 Nutritional anemia, unspecified: Secondary | ICD-10-CM | POA: Insufficient documentation

## 2015-12-11 DIAGNOSIS — R7401 Elevation of levels of liver transaminase levels: Secondary | ICD-10-CM | POA: Insufficient documentation

## 2015-12-18 ENCOUNTER — Ambulatory Visit
Admission: RE | Admit: 2015-12-18 | Discharge: 2015-12-18 | Disposition: A | Discharge status: Home / Self Care | Payer: PRIVATE HEALTH INSURANCE | Source: Ambulatory Visit | Attending: Obstetrics & Gynecology | Admitting: Obstetrics & Gynecology

## 2015-12-18 ENCOUNTER — Encounter: Payer: Self-pay | Admitting: *Deleted

## 2015-12-18 VITALS — BP 130/65 | HR 58 | Temp 98.2°F | Resp 18 | Wt 215.0 lb

## 2015-12-18 DIAGNOSIS — J4520 Mild intermittent asthma, uncomplicated: Secondary | ICD-10-CM

## 2015-12-18 DIAGNOSIS — N83529 Torsion of fallopian tube, unspecified side: Secondary | ICD-10-CM | POA: Diagnosis not present

## 2015-12-18 DIAGNOSIS — O99013 Anemia complicating pregnancy, third trimester: Secondary | ICD-10-CM | POA: Insufficient documentation

## 2015-12-18 DIAGNOSIS — O99011 Anemia complicating pregnancy, first trimester: Secondary | ICD-10-CM | POA: Insufficient documentation

## 2015-12-18 DIAGNOSIS — D509 Iron deficiency anemia, unspecified: Secondary | ICD-10-CM | POA: Insufficient documentation

## 2015-12-18 DIAGNOSIS — Z903 Acquired absence of stomach [part of]: Secondary | ICD-10-CM | POA: Diagnosis not present

## 2015-12-18 HISTORY — DX: Anemia, unspecified: D64.9

## 2015-12-18 HISTORY — DX: Sleep apnea, unspecified: G47.30

## 2015-12-18 NOTE — Progress Notes (Signed)
Maternal-Fetal Medicine Consultation: Brooke Pearson is a 30 year-old G3 P2002 with LMP of 10/10/15 [facility](EDC 07/16/16) who presents for MFM consultation on referral by [facility]. Brooke Pearson had a laparoscopic appendectomy and right salpingectomy on 11/02/15 for presumed appendicitis. Upon entry into the abdomen, a torsed right fallopian tube was identified.  Her pregnancy test on the day of surgery was negative.  Since then, Brooke Pearson has done well. She had an ultrasound on 11/20/15 which demonstrated an intrauterine pregnancy, as a gestational sac was seen but it was too early to see a yolk sac or fetal pole. She has not yet had a follow up ultrasound but states she has one scheduled tomorrow at [facility].  In addition, Brooke Pearson has a history of laparoscopic gastric bypass surgery that involved both a gastric restriction and a malabsorption procedure. She has a long history of iron deficiency anemia and continues to struggle with iron intake since her surgery.  She also states that her asthma symptoms have gotten worse since becoming pregnant. She is not on a maintenance medication but is now using her rescue inhaler about 4-5 times per week.    She denies vaginal bleeding or abdominal pain.  PMH: Obesity, anemia, mild intermittent asthma PSH:    2015: laparoscopic gastric bypass (restrictive and malabsorptive)   04/2015: lapascopic cholecystectomy   10/2015: laparoscopic appendectomy and right salpingectomy PObH: G3 P2002   2007: SVD at 39 weeks, female, 7 pounds 9 ounces, gestational HTN, induction of labor   2013: SVD at 5139 weeks, female, 8 pounds 12 ounces, no complications, SROM and augmentation of labor  Meds: PNV, albuterol MDI, iron, macrobid (recent UTI) Allergies  Allergen Reactions  . Butorphanol Tartrate Nausea And Vomiting   SH: Married. Denies tobacco use FH: Youngest son with type I DM. No FH of birth defects or genetic disorders ROS: No complaints except occasional  wheezing  Exam: Vitals:   12/18/15 1105  BP: 130/65  Pulse: (!) 58  Resp: 18  Temp: 98.2 F (36.8 C)   Prenatal labs (12/09/15): GC/Chl neg, Urine C&S with Ecoli 5-10,000, Urine P/C 80 mg. AST 27, ALT 39, Cr 0.6, Total Bili 0.3, Hgb 8.8, MCV 69, Blood type O positive, antibody screen neg, Hep B neg, RPR non-reactive, HIV non-reactive, Hep C neg (2016)  Assessment and Recommendations: 30 year-old G3 P2002 at 6711 2/7 weeks by LMP with history of gastric bypass surgery, anemia, mild asthma, and recent surgical procedure (laparscopic right salpingectomy and appendectomy for right fallopian tube torsion) who had exposure to general anesthesia early in pregnancy. Her pregnancy test was negative the day of surgery and implantation likely occurred shortly around or immediately following the surgery.    It is unlikely that the surgical procedure will have any effect on the pregnancy. Exposures at this point in pregnancy typically cause an all (risk for miscarriage) or nothing effect.  Brooke Pearson has had an ultrasound that demonstrated the presence of an intrauterine pregnancy and has a planned viability US scheduled with [facility].  She has had gastric bypass surgery and is currently taking multivitamin replacement. She has been followed by her gastric bypass surgeon and has not had vitamin/mineral deficiencies.  Microcytic anemia.  It is not clear if Brooke Pearson has had a hemoglobin electrophoresis. She reports a history of iron-deficiency anemia but also needs to be ruled out for thalassemia in the setting of microcytic anemia. She has a hematology consult appt scheduled so I did not do any labs to evaluate her  anemia today. If she is indeed found to have iron-deficiency, she may benefit form an iron infusion, which is safe in pregnancy  -See above recs -currently being treated for a UTI -Declines aneuploidy screening -Has viability scan scheduled -Has hematology consult scheduled to workup  anemia -Likely needs to be placed on an inhaled steroid as her asthma symptoms are worsening. Severina is going to call her PCP. Inhaled corticosteroids are safe in pregnancy -Recommend daily baby Asprin starting at 13 weeks -Has a glucola scheduled. If she cannot tolerate the glucose load, she can do some screening fasting and 2hr PP sugars at home -Follow fetal growth by [place] in setting of obesity  Delaila Nand, [place] A, MD

## 2015-12-23 ENCOUNTER — Ambulatory Visit: Payer: PRIVATE HEALTH INSURANCE

## 2015-12-24 ENCOUNTER — Inpatient Hospital Stay: Payer: PRIVATE HEALTH INSURANCE | Attending: Internal Medicine | Admitting: Internal Medicine

## 2015-12-24 ENCOUNTER — Inpatient Hospital Stay: Payer: PRIVATE HEALTH INSURANCE

## 2015-12-24 VITALS — BP 115/68 | HR 71 | Temp 97.3°F | Resp 16 | Ht 68.5 in | Wt 215.6 lb

## 2015-12-24 DIAGNOSIS — O99011 Anemia complicating pregnancy, first trimester: Secondary | ICD-10-CM | POA: Diagnosis not present

## 2015-12-24 DIAGNOSIS — J45901 Unspecified asthma with (acute) exacerbation: Secondary | ICD-10-CM | POA: Insufficient documentation

## 2015-12-24 DIAGNOSIS — Z3A12 12 weeks gestation of pregnancy: Secondary | ICD-10-CM | POA: Insufficient documentation

## 2015-12-24 DIAGNOSIS — D509 Iron deficiency anemia, unspecified: Secondary | ICD-10-CM | POA: Diagnosis not present

## 2015-12-24 DIAGNOSIS — D508 Other iron deficiency anemias: Secondary | ICD-10-CM

## 2015-12-24 DIAGNOSIS — Z79899 Other long term (current) drug therapy: Secondary | ICD-10-CM | POA: Insufficient documentation

## 2015-12-24 LAB — CBC WITH DIFFERENTIAL/PLATELET
Basophils Absolute: 0 10*3/uL (ref 0–0.1)
Basophils Relative: 0 %
Eosinophils Absolute: 0.1 10*3/uL (ref 0–0.7)
Eosinophils Relative: 2 %
HCT: 26.4 % — ABNORMAL LOW (ref 35.0–47.0)
Hemoglobin: 8.5 g/dL — ABNORMAL LOW (ref 12.0–16.0)
Lymphocytes Relative: 28 %
Lymphs Abs: 1.6 10*3/uL (ref 1.0–3.6)
MCH: 22.9 pg — ABNORMAL LOW (ref 26.0–34.0)
MCHC: 32.3 g/dL (ref 32.0–36.0)
MCV: 70.9 fL — ABNORMAL LOW (ref 80.0–100.0)
Monocytes Absolute: 0.5 10*3/uL (ref 0.2–0.9)
Monocytes Relative: 8 %
Neutro Abs: 3.7 10*3/uL (ref 1.4–6.5)
Neutrophils Relative %: 62 %
Platelets: 242 10*3/uL (ref 150–440)
RBC: 3.72 MIL/uL — ABNORMAL LOW (ref 3.80–5.20)
RDW: 18.4 % — ABNORMAL HIGH (ref 11.5–14.5)
WBC: 5.9 10*3/uL (ref 3.6–11.0)

## 2015-12-24 LAB — VITAMIN B12: Vitamin B-12: 846 pg/mL (ref 180–914)

## 2015-12-24 LAB — IRON AND TIBC
Iron: 14 ug/dL — ABNORMAL LOW (ref 28–170)
Saturation Ratios: 3 % — ABNORMAL LOW (ref 10.4–31.8)
TIBC: 412 ug/dL (ref 250–450)
UIBC: 398 ug/dL

## 2015-12-24 LAB — FERRITIN: Ferritin: 5 ng/mL — ABNORMAL LOW (ref 11–307)

## 2015-12-24 NOTE — Progress Notes (Signed)
Patient has history of anemia with history of bariatric surgery.  Also is currently pregnant with due date of 07/16/16.

## 2015-12-24 NOTE — Progress Notes (Signed)
[facility]  Telephone:(336) [phone] Fax:(336) [phone]  ID: Brooke PaganiniKristina L Pearson OB: 04-29-1985  MR#: [id number]  AOZ#:[id number]#:[id number]  Patient Care Team: Kandyce RudMarcus Babaoff, MD as PCP - General (Family Medicine)  CHIEF COMPLAINT: New Evaluation (anemia) Anemia at [redacted] weeks gestation  HPI: Very pleasant 30 year old lady in her 6712 weeks gestation was noted to have a Hemoglobin is 11.2 back in May 2015 with an MCV of 78 in March 2017 and was noted to be 9.5 with an MCV of 70 and an most recent one from 11/14/2015 showed a hemoglobin of 9.6 with an MCV of 68.7  Currently taking iron sulfate 325 mg tablets once daily. She's also on nitrofurantoin and prenatal vitamins.  She has a history of laparoscopic appendectomy and right salpingectomy on 11/02/2015, torsion of the right fallopian tube was identified along with appendicitis.  History of laparoscopic gastric bypass surgery and a  long-standing history of iron deficiency anemia  History of bronchial asthma- with mild exacerbation recently  She feels chronically tired, no melena, heartburn, no nausea or vomiting, no weight loss, no excess menses in the past No chest pain, cough at present or SOB  Results review ( External and Internal):  White cell count has been normal platelets have been normal.  Last IM panel from May 2015 showed a low serum iron of 48 ferritin 11 saturation 13%.  CMP are unremarkable from 11/14/2015  A CT scan f acute appendicitis with  periappendiceal inflammatory changes involvhickening.   LAB RESULTS:  Lab Results  Component Value Date   NA 139 11/14/2015   K 3.4 (L) 11/14/2015   CL 110 11/14/2015   CO2 24 11/14/2015   GLUCOSE 92 11/14/2015   BUN 10 11/14/2015   CREATININE 0.70 11/14/2015   CALCIUM 8.7 (L) 11/14/2015   PROT 7.2 11/14/2015   ALBUMIN 4.0 11/14/2015   AST 29 11/14/2015   ALT 41 11/14/2015   ALKPHOS 110 11/14/2015   BILITOT 0.6 11/14/2015   GFRNONAA >60 11/14/2015   GFRAA >60 11/14/2015    Lab Results  Component Value Date   WBC 5.9 12/24/2015   NEUTROABS 3.7 12/24/2015   HGB 8.5 (L) 12/24/2015   HCT 26.4 (L) 12/24/2015   MCV 70.9 (L) 12/24/2015   PLT 242 12/24/2015      REVIEW OF SYSTEMS:   Review of Systems  Constitutional: Positive for malaise/fatigue. Negative for chills, fever and weight loss.  HENT: Negative.   Eyes: Negative.   Respiratory: Negative.   Cardiovascular: Negative.   Gastrointestinal: Negative.   Genitourinary: Negative.   Musculoskeletal: Negative.   Skin: Negative.   Neurological: Negative.   Endo/Heme/Allergies: Negative.   Psychiatric/Behavioral: Negative.   All other systems reviewed and are negative.   As per HPI. Otherwise, a complete review of systems is negative.  PAST MEDICAL HISTORY: Past Medical History:  Diagnosis Date  . Anemia   . Asthma   . Sleep apnea     PAST SURGICAL HISTORY: Past Surgical History:  Procedure Laterality Date  . ABDOMINAL SURGERY  aug 2015   gastric bypass  . CHOLECYSTECTOMY    . GASTRIC BYPASS  aug 2015  . HERNIA REPAIR  aug 2015  . LAPAROSCOPIC APPENDECTOMY N/A 11/02/2015   Procedure: APPENDECTOMY LAPAROSCOPIC;  Surgeon: Tiney Rougealph Ely III, MD;  Location: [facility] ORS;  Service: General;  Laterality: N/A;  . LAPAROSCOPIC UNILATERAL SALPINGECTOMY Right 11/02/2015   Procedure: LAPAROSCOPIC RIGHT PARTIAL SALPINGECTOMY;  Surgeon: Tiney Rougealph Ely III, MD;  Location: [facility] ORS;  Service:  General;  Laterality: Right;  Performed by Dr. Bonney AidStaebler     FAMILY HISTORY: No family history on file.  Allergies  Allergen Reactions  . Butorphanol Tartrate Nausea And Vomiting    Vitals:   12/24/15 1107  BP: 115/68  Pulse: 71  Resp: 16  Temp: 97.3 F (36.3 C)     Body mass index is 32.3 kg/m.   2.17 meters squared    Physical Exam  Constitutional: She is oriented to person, place, and time. She appears well-developed and well-nourished. No distress.  HENT:  Head: Normocephalic and  atraumatic.  Eyes: EOM are normal. Pupils are equal, round, and reactive to light.  Neck: Normal range of motion. Neck supple. No tracheal deviation present. No thyromegaly present.  Cardiovascular: Normal rate, regular rhythm and normal heart sounds.   Pulmonary/Chest: Effort normal and breath sounds normal. No respiratory distress. She has no wheezes.  Abdominal: Soft. Bowel sounds are normal. She exhibits no distension. There is no tenderness.  Musculoskeletal: Normal range of motion. She exhibits no edema, tenderness or deformity.  Lymphadenopathy:    She has no cervical adenopathy.  Neurological: She is alert and oriented to person, place, and time. No cranial nerve deficit.  Skin: Skin is warm and dry. No erythema. There is pallor.  Psychiatric: She has a normal mood and affect. Her behavior is normal. Judgment and thought content normal.    Impression and plan:  IDA- chronic likely due to gastric bypass and with increased demand due to current 12 weeks gestataion  Hgb today is 8.5 despite currently taking iron sulfate 325 mg tablets once daily. She is unlikely to be bale to meet the demands with oral iron alone given the history of gastric bypass, therefore we will begin IV Venofer I have talked to her about the side effects , risks and alternatives that may inclclude blood transfusion, She agrees to proceed   I will check her b12 l;evel  Mild exacerbation of brochial asthma recently, now stable,, advised to contact her pcp for optimizing her meds  Return in about 5 weeks (around 01/28/2016). ----------------------------------------------------------------------------------------------------------------- Patient expressed understanding and was in agreement with this plan. She also understands that She can call clinic at any time with any questions, concerns, or complaints.     -----------------------------------------------------------------------------------------------------------------   This note was generated in part with voice recognition software and I apologize for any typographical errors that were not detected and corrected.    Towana BadgerSirisha Amauri Medellin, MD   01/13/2016 1:41 PM

## 2015-12-30 ENCOUNTER — Inpatient Hospital Stay: Payer: PRIVATE HEALTH INSURANCE

## 2015-12-30 VITALS — BP 104/66 | HR 67 | Temp 96.6°F | Resp 18

## 2015-12-30 DIAGNOSIS — O99011 Anemia complicating pregnancy, first trimester: Secondary | ICD-10-CM

## 2015-12-30 DIAGNOSIS — D508 Other iron deficiency anemias: Secondary | ICD-10-CM

## 2015-12-30 MED ORDER — IRON SUCROSE 20 MG/ML IV SOLN
200.0000 mg | Freq: Once | INTRAVENOUS | Status: AC
  Administered 2015-12-30: 200 mg via INTRAVENOUS
  Filled 2015-12-30 (×2): qty 10

## 2015-12-30 MED ORDER — SODIUM CHLORIDE 0.9 % IV SOLN: 200.0000 mg | Freq: Once | INTRAVENOUS | Status: DC

## 2015-12-30 MED ORDER — SODIUM CHLORIDE 0.9 % IV SOLN
Freq: Once | INTRAVENOUS | Status: AC
  Administered 2015-12-30: 14:00:00 via INTRAVENOUS
  Filled 2015-12-30: qty 1000

## 2016-01-06 ENCOUNTER — Inpatient Hospital Stay: Payer: PRIVATE HEALTH INSURANCE

## 2016-01-09 ENCOUNTER — Inpatient Hospital Stay: Payer: PRIVATE HEALTH INSURANCE

## 2016-01-09 VITALS — BP 108/72 | HR 64 | Temp 97.0°F | Resp 20

## 2016-01-09 DIAGNOSIS — D508 Other iron deficiency anemias: Secondary | ICD-10-CM

## 2016-01-09 DIAGNOSIS — O99011 Anemia complicating pregnancy, first trimester: Secondary | ICD-10-CM

## 2016-01-09 MED ORDER — SODIUM CHLORIDE 0.9 % IV SOLN
Freq: Once | INTRAVENOUS | Status: AC
  Administered 2016-01-09: 14:00:00 via INTRAVENOUS
  Filled 2016-01-09: qty 1000

## 2016-01-09 MED ORDER — SODIUM CHLORIDE 0.9 % IV SOLN: 200.0000 mg | Freq: Once | INTRAVENOUS | Status: DC

## 2016-01-09 MED ORDER — IRON SUCROSE 20 MG/ML IV SOLN
200.0000 mg | Freq: Once | INTRAVENOUS | Status: AC
  Administered 2016-01-09: 200 mg via INTRAVENOUS
  Filled 2016-01-09 (×3): qty 10

## 2016-01-28 ENCOUNTER — Ambulatory Visit: Payer: PRIVATE HEALTH INSURANCE

## 2016-01-28 ENCOUNTER — Other Ambulatory Visit: Payer: PRIVATE HEALTH INSURANCE

## 2016-02-04 DIAGNOSIS — K912 Postsurgical malabsorption, not elsewhere classified: Secondary | ICD-10-CM | POA: Insufficient documentation

## 2016-02-10 ENCOUNTER — Inpatient Hospital Stay: Payer: PRIVATE HEALTH INSURANCE

## 2016-02-10 ENCOUNTER — Other Ambulatory Visit: Payer: Self-pay | Admitting: Oncology

## 2016-02-10 ENCOUNTER — Encounter: Payer: Self-pay | Admitting: *Deleted

## 2016-02-10 ENCOUNTER — Encounter: Payer: Self-pay | Admitting: Oncology

## 2016-02-10 ENCOUNTER — Inpatient Hospital Stay: Payer: PRIVATE HEALTH INSURANCE | Admitting: Oncology

## 2016-02-10 ENCOUNTER — Inpatient Hospital Stay: Payer: PRIVATE HEALTH INSURANCE | Attending: Oncology | Admitting: Oncology

## 2016-02-10 VITALS — BP 129/86 | HR 74 | Temp 97.8°F | Resp 18 | Ht 68.5 in | Wt 216.9 lb

## 2016-02-10 DIAGNOSIS — D509 Iron deficiency anemia, unspecified: Secondary | ICD-10-CM | POA: Diagnosis not present

## 2016-02-10 DIAGNOSIS — Z79899 Other long term (current) drug therapy: Secondary | ICD-10-CM | POA: Diagnosis not present

## 2016-02-10 DIAGNOSIS — O99012 Anemia complicating pregnancy, second trimester: Secondary | ICD-10-CM | POA: Diagnosis present

## 2016-02-10 DIAGNOSIS — O9912 Other diseases of the blood and blood-forming organs and certain disorders involving the immune mechanism complicating childbirth: Secondary | ICD-10-CM

## 2016-02-10 DIAGNOSIS — D508 Other iron deficiency anemias: Secondary | ICD-10-CM

## 2016-02-10 DIAGNOSIS — Z3A18 18 weeks gestation of pregnancy: Secondary | ICD-10-CM | POA: Diagnosis not present

## 2016-02-10 DIAGNOSIS — Z9884 Bariatric surgery status: Secondary | ICD-10-CM

## 2016-02-10 LAB — CBC
HEMATOCRIT: 30.2 % — AB (ref 35.0–47.0)
HEMOGLOBIN: 10.2 g/dL — AB (ref 12.0–16.0)
MCH: 25.7 pg — ABNORMAL LOW (ref 26.0–34.0)
MCHC: 33.6 g/dL (ref 32.0–36.0)
MCV: 76.6 fL — ABNORMAL LOW (ref 80.0–100.0)
Platelets: 212 10*3/uL (ref 150–440)
RBC: 3.95 MIL/uL (ref 3.80–5.20)
RDW: 21.4 % — ABNORMAL HIGH (ref 11.5–14.5)
WBC: 7.5 10*3/uL (ref 3.6–11.0)

## 2016-02-10 LAB — IRON AND TIBC
IRON: 41 ug/dL (ref 28–170)
Saturation Ratios: 10 % — ABNORMAL LOW (ref 10.4–31.8)
TIBC: 397 ug/dL (ref 250–450)
UIBC: 356 ug/dL

## 2016-02-10 LAB — FERRITIN: Ferritin: 7 ng/mL — ABNORMAL LOW (ref 11–307)

## 2016-02-10 MED ORDER — SODIUM CHLORIDE 0.9 % IV SOLN: 200.0000 mg | INTRAVENOUS | Status: DC

## 2016-02-10 NOTE — Progress Notes (Signed)
Hematology/Oncology Consult note [facility]  [phone] [phone]  Patient Care Team: Kandyce RudMarcus Babaoff, MD as PCP - General (Family Medicine)   Name of the patient: Brooke Pearson  [id number]  07-13-1985   Date of visit: 02/10/16  Diagnosis- and deficiency anemia likely secondary to prior gastric bypass in present pregnancy Chief complaint/ Reason for visit- routine follow-up of iron deficiency anemia for IV iron  Heme/Onc history: Patient is a 30 year old female who is currently at 18 weeks of gestation. She is G3P2L2. No problems with prior pregnancies. She continued to have anemia despite taking oral iron and was started on Venofer. She has a history of laparoscopy gastric bypass surgery after her second pregnancy and prior history of iron deficiency anemia as well. She is due to deliver on 07/16/2016.  Interval history- overall she is doing well her pregnancy. She is currently taking her multivitamins regularly   Review of systems- Review of Systems  Constitutional: Negative for chills, fever, malaise/fatigue and weight loss.  HENT: Negative for congestion, ear discharge and nosebleeds.   Eyes: Negative for blurred vision.  Respiratory: Negative for cough, hemoptysis, sputum production, shortness of breath and wheezing.   Cardiovascular: Negative for chest pain, palpitations, orthopnea and claudication.  Gastrointestinal: Negative for abdominal pain, blood in stool, constipation, diarrhea, heartburn, melena, nausea and vomiting.  Genitourinary: Negative for dysuria, flank pain, frequency, hematuria and urgency.  Musculoskeletal: Negative for back pain, joint pain and myalgias.  Skin: Negative for rash.  Neurological: Negative for dizziness, tingling, focal weakness, seizures, weakness and headaches.  Endo/Heme/Allergies: Does not bruise/bleed easily.  Psychiatric/Behavioral: Negative for depression and suicidal ideas. The patient  does not have insomnia.      Current treatment- IV Venofer   Allergies  Allergen Reactions   Butorphanol Tartrate Nausea And Vomiting     Past Medical History:  Diagnosis Date   Anemia    Asthma    Sleep apnea      Past Surgical History:  Procedure Laterality Date   ABDOMINAL SURGERY  aug 2015   gastric bypass   CHOLECYSTECTOMY     GASTRIC BYPASS  aug 2015   HERNIA REPAIR  aug 2015   LAPAROSCOPIC APPENDECTOMY N/A 11/02/2015   Procedure: APPENDECTOMY LAPAROSCOPIC;  Surgeon: Tiney Rougealph Ely III, MD;  Location: [facility] ORS;  Service: General;  Laterality: N/A;   LAPAROSCOPIC UNILATERAL SALPINGECTOMY Right 11/02/2015   Procedure: LAPAROSCOPIC RIGHT PARTIAL SALPINGECTOMY;  Surgeon: Tiney Rougealph Ely III, MD;  Location: [facility] ORS;  Service: General;  Laterality: Right;  Performed by Dr. Bonney AidStaebler     Social History   Social History   Marital status: Single    Spouse name: N/A   Number of children: N/A   Years of education: N/A   Occupational History   Not on file.   Social History Main Topics   Smoking status: Never Smoker   Smokeless tobacco: Never Used   Alcohol use No     Comment: social   Drug use: No   Sexual activity: Yes   Other Topics Concern   Not on file   Social History Narrative   No narrative on file    No family history on file.   Current Outpatient Prescriptions:    acidophilus (RISAQUAD) CAPS capsule, Take 1 capsule by mouth daily., Disp: , Rfl:    albuterol (PROVENTIL HFA;VENTOLIN HFA) 108 (90 Base) MCG/ACT inhaler, Inhale 2 puffs into the lungs every 4 (four) hours as needed for wheezing or shortness of breath.,  Disp: , Rfl:    ferrous sulfate 325 (65 FE) MG EC tablet, Take by mouth., Disp: , Rfl:    Multiple Vitamin (MULTI-VITAMINS) TABS, Take 1 tablet by mouth daily., Disp: , Rfl:    nitrofurantoin, macrocrystal-monohydrate, (MACROBID) 100 MG capsule, Take 100 mg by mouth 2 (two) times daily., Disp: , Rfl:    Prenatal Vit-Fe  Fumarate-FA (PRENATAL MULTIVITAMIN) TABS tablet, Take 1 tablet by mouth daily at 12 noon., Disp: , Rfl:   Physical exam:  Vitals:   02/10/16 0917  BP: 129/86  Pulse: 74  Resp: 18  Temp: 97.8 F (36.6 C)  TempSrc: Tympanic  Weight: 216 lb 14.9 oz (98.4 kg)  Height: 5' 8.5" (1.74 m)   Physical Exam  Constitutional: She is oriented to person, place, and time and well-developed, well-nourished, and in no distress.  HENT:  Head: Normocephalic and atraumatic.  Eyes: EOM are normal. Pupils are equal, round, and reactive to light.  Neck: Normal range of motion.  Cardiovascular: Normal rate, regular rhythm and normal heart sounds.   Pulmonary/Chest: Effort normal and breath sounds normal.  Abdominal: Soft. Bowel sounds are normal.  Gravid uterus  Neurological: She is alert and oriented to person, place, and time.  Skin: Skin is warm and dry.     CMP Latest Ref Rng & Units 11/14/2015  Glucose 65 - 99 mg/dL 92  BUN 6 - 20 mg/dL 10  Creatinine 1.610.44 - 0.961.00 mg/dL 0.450.70  Sodium 409135 - 811145 mmol/L 139  Potassium 3.5 - 5.1 mmol/L 3.4(L)  Chloride 101 - 111 mmol/L 110  CO2 22 - 32 mmol/L 24  Calcium 8.9 - 10.3 mg/dL 9.1(Y8.7(L)  Total Protein 6.5 - 8.1 g/dL 7.2  Total Bilirubin 0.3 - 1.2 mg/dL 0.6  Alkaline Phos 38 - 126 U/L 110  AST 15 - 41 U/L 29  ALT 14 - 54 U/L 41   CBC Latest Ref Rng & Units 02/10/2016  WBC 3.6 - 11.0 K/uL 7.5  Hemoglobin 12.0 - 16.0 g/dL 10.2(L)  Hematocrit 35.0 - 47.0 % 30.2(L)  Platelets 150 - 440 K/uL 212    No images are attached to the encounter.  No results found.   Assessment and plan- Patient is a 30 y.o. female Currently in her second trimester of pregnancy who is seeing us for iron deficiency anemia likely secondary to prior gastric bypass and ongoing pregnancy  1. Patient has received 2 doses of Venofer. She is tolerating Venofer well without any side effects. Her iron studies from today are pending. We will plan to give her 3 more doses of Venofer over  the next 2 weeks. I will see her back 4 weeks after the last dose of Venofer with CBC and iron studies   Visit Diagnosis 1. Other iron deficiency anemia      Dr. Owens SharkArchana Rao, MD, MPH [facility] at [facility] 02/10/2016 9:00 AM

## 2016-02-10 NOTE — Progress Notes (Signed)
She feels better from iron. She has some cold like sx.  But uses her inhaler and breathing treatment and it works for her.

## 2016-02-13 ENCOUNTER — Inpatient Hospital Stay: Payer: PRIVATE HEALTH INSURANCE

## 2016-02-13 VITALS — BP 116/73 | HR 70 | Temp 98.3°F | Resp 18

## 2016-02-13 DIAGNOSIS — D508 Other iron deficiency anemias: Secondary | ICD-10-CM

## 2016-02-13 DIAGNOSIS — O99012 Anemia complicating pregnancy, second trimester: Secondary | ICD-10-CM | POA: Diagnosis not present

## 2016-02-13 DIAGNOSIS — O99011 Anemia complicating pregnancy, first trimester: Secondary | ICD-10-CM

## 2016-02-13 MED ORDER — IRON SUCROSE 20 MG/ML IV SOLN
200.0000 mg | Freq: Once | INTRAVENOUS | Status: AC
  Administered 2016-02-13: 200 mg via INTRAVENOUS
  Filled 2016-02-13: qty 10

## 2016-02-13 MED ORDER — SODIUM CHLORIDE 0.9 % IV SOLN: 200.0000 mg | Freq: Once | INTRAVENOUS | Status: DC

## 2016-02-13 MED ORDER — SODIUM CHLORIDE 0.9 % IV SOLN
Freq: Once | INTRAVENOUS | Status: AC
  Administered 2016-02-13: 14:00:00 via INTRAVENOUS
  Filled 2016-02-13: qty 1000

## 2016-02-20 ENCOUNTER — Inpatient Hospital Stay: Payer: PRIVATE HEALTH INSURANCE

## 2016-02-20 VITALS — BP 118/74 | HR 64 | Temp 97.4°F | Resp 18

## 2016-02-20 DIAGNOSIS — D508 Other iron deficiency anemias: Secondary | ICD-10-CM

## 2016-02-20 DIAGNOSIS — O99011 Anemia complicating pregnancy, first trimester: Secondary | ICD-10-CM

## 2016-02-20 DIAGNOSIS — O99012 Anemia complicating pregnancy, second trimester: Secondary | ICD-10-CM | POA: Diagnosis not present

## 2016-02-20 MED ORDER — SODIUM CHLORIDE 0.9 % IV SOLN
Freq: Once | INTRAVENOUS | Status: AC
Start: 2016-02-20 — End: 2016-02-20
  Administered 2016-02-20: 14:00:00 via INTRAVENOUS
  Filled 2016-02-20: qty 1000

## 2016-02-20 MED ORDER — SODIUM CHLORIDE 0.9 % IV SOLN: 200.0000 mg | Freq: Once | INTRAVENOUS | Status: DC

## 2016-02-20 MED ORDER — IRON SUCROSE 20 MG/ML IV SOLN
200.0000 mg | Freq: Once | INTRAVENOUS | Status: AC
  Administered 2016-02-20: 200 mg via INTRAVENOUS
  Filled 2016-02-20: qty 10

## 2016-02-23 NOTE — L&D Delivery Note (Addendum)
Delivery Note At 7:07 AM a viable female  was delivered via Vaginal, Spontaneous Delivery (Presentation: ;vtx  ).  APGAR:8/9 , ; weight  .   Placenta status:intact stained mec , .  Cord:3 v delayed  with the following complications: None.   Anesthesia:  none Episiotomy:  none Lacerations:  none Suture Repair: n/a Est. Blood Loss (mL):  200cc Rapid second stage from 8 cm to delivery . Meconium noted . Spontaneous cry . Peds in attendance  Mom to postpartum.  Baby to Couplet care / Skin to Skin.  Ihor Austinhomas J Schermerhorn 07/01/2016, 7:21 AM

## 2016-02-26 ENCOUNTER — Other Ambulatory Visit: Payer: Self-pay | Admitting: *Deleted

## 2016-02-27 ENCOUNTER — Inpatient Hospital Stay: Payer: PRIVATE HEALTH INSURANCE | Attending: Oncology

## 2016-02-27 VITALS — BP 122/78 | HR 66 | Temp 97.7°F | Resp 18

## 2016-02-27 DIAGNOSIS — Z79899 Other long term (current) drug therapy: Secondary | ICD-10-CM | POA: Insufficient documentation

## 2016-02-27 DIAGNOSIS — D509 Iron deficiency anemia, unspecified: Secondary | ICD-10-CM | POA: Insufficient documentation

## 2016-02-27 DIAGNOSIS — Z9884 Bariatric surgery status: Secondary | ICD-10-CM | POA: Insufficient documentation

## 2016-02-27 DIAGNOSIS — Z3A18 18 weeks gestation of pregnancy: Secondary | ICD-10-CM | POA: Insufficient documentation

## 2016-02-27 DIAGNOSIS — O99011 Anemia complicating pregnancy, first trimester: Secondary | ICD-10-CM

## 2016-02-27 DIAGNOSIS — O99012 Anemia complicating pregnancy, second trimester: Secondary | ICD-10-CM | POA: Diagnosis present

## 2016-02-27 DIAGNOSIS — D508 Other iron deficiency anemias: Secondary | ICD-10-CM

## 2016-02-27 MED ORDER — SODIUM CHLORIDE 0.9 % IV SOLN
Freq: Once | INTRAVENOUS | Status: AC
  Administered 2016-02-27: 14:00:00 via INTRAVENOUS
  Filled 2016-02-27: qty 1000

## 2016-02-27 MED ORDER — SODIUM CHLORIDE 0.9 % IV SOLN: 200.0000 mg | Freq: Once | INTRAVENOUS | Status: DC

## 2016-02-27 MED ORDER — IRON SUCROSE 20 MG/ML IV SOLN
200.0000 mg | Freq: Once | INTRAVENOUS | Status: AC
  Administered 2016-02-27: 200 mg via INTRAVENOUS
  Filled 2016-02-27: qty 10

## 2016-03-12 ENCOUNTER — Other Ambulatory Visit: Payer: PRIVATE HEALTH INSURANCE

## 2016-03-12 ENCOUNTER — Ambulatory Visit: Payer: PRIVATE HEALTH INSURANCE | Admitting: Oncology

## 2016-03-26 ENCOUNTER — Encounter: Payer: Self-pay | Admitting: Oncology

## 2016-03-26 ENCOUNTER — Inpatient Hospital Stay: Payer: PRIVATE HEALTH INSURANCE | Attending: Oncology

## 2016-03-26 ENCOUNTER — Inpatient Hospital Stay (HOSPITAL_BASED_OUTPATIENT_CLINIC_OR_DEPARTMENT_OTHER): Payer: PRIVATE HEALTH INSURANCE | Admitting: Oncology

## 2016-03-26 VITALS — BP 125/75 | HR 62 | Temp 95.8°F | Resp 18 | Wt 221.6 lb

## 2016-03-26 DIAGNOSIS — Z3A24 24 weeks gestation of pregnancy: Secondary | ICD-10-CM | POA: Diagnosis not present

## 2016-03-26 DIAGNOSIS — D508 Other iron deficiency anemias: Secondary | ICD-10-CM

## 2016-03-26 DIAGNOSIS — O99012 Anemia complicating pregnancy, second trimester: Secondary | ICD-10-CM | POA: Diagnosis not present

## 2016-03-26 DIAGNOSIS — J45909 Unspecified asthma, uncomplicated: Secondary | ICD-10-CM | POA: Insufficient documentation

## 2016-03-26 DIAGNOSIS — Z9884 Bariatric surgery status: Secondary | ICD-10-CM | POA: Diagnosis not present

## 2016-03-26 DIAGNOSIS — Z79899 Other long term (current) drug therapy: Secondary | ICD-10-CM | POA: Diagnosis not present

## 2016-03-26 DIAGNOSIS — D509 Iron deficiency anemia, unspecified: Secondary | ICD-10-CM | POA: Diagnosis not present

## 2016-03-26 DIAGNOSIS — G4730 Sleep apnea, unspecified: Secondary | ICD-10-CM | POA: Insufficient documentation

## 2016-03-26 LAB — CBC WITH DIFFERENTIAL/PLATELET
BASOS PCT: 0 %
Basophils Absolute: 0 10*3/uL (ref 0–0.1)
Eosinophils Absolute: 0.1 10*3/uL (ref 0–0.7)
Eosinophils Relative: 2 %
HEMATOCRIT: 31.2 % — AB (ref 35.0–47.0)
HEMOGLOBIN: 10.6 g/dL — AB (ref 12.0–16.0)
Lymphocytes Relative: 24 %
Lymphs Abs: 1.6 10*3/uL (ref 1.0–3.6)
MCH: 28.2 pg (ref 26.0–34.0)
MCHC: 33.9 g/dL (ref 32.0–36.0)
MCV: 83.1 fL (ref 80.0–100.0)
MONOS PCT: 6 %
Monocytes Absolute: 0.4 10*3/uL (ref 0.2–0.9)
NEUTROS ABS: 4.8 10*3/uL (ref 1.4–6.5)
NEUTROS PCT: 68 %
Platelets: 174 10*3/uL (ref 150–440)
RBC: 3.76 MIL/uL — ABNORMAL LOW (ref 3.80–5.20)
RDW: 19.5 % — ABNORMAL HIGH (ref 11.5–14.5)
WBC: 6.9 10*3/uL (ref 3.6–11.0)

## 2016-03-26 LAB — IRON AND TIBC
Iron: 67 ug/dL (ref 28–170)
SATURATION RATIOS: 18 % (ref 10.4–31.8)
TIBC: 377 ug/dL (ref 250–450)
UIBC: 310 ug/dL

## 2016-03-26 LAB — FERRITIN: FERRITIN: 14 ng/mL (ref 11–307)

## 2016-03-26 NOTE — Progress Notes (Signed)
Hematology/Oncology Consult note [facility]  [phone] [phone]  Patient Care Team: Kandyce Rud, MD as PCP - General (Family Medicine)   Name of the patient: Brooke Pearson  [id number]  16-Mar-1985   Date of visit: 03/26/16  Diagnosis- iron deficiency anemia likely secondary to prior gastric bypass. Patient is currently pregnant  Chief complaint/ Reason for visit- routine f/u  Heme/Onc history: Patient is a 31 year old female who is currently at 18 weeks of gestation. She is G3P2L2. No problems with prior pregnancies. She continued to have anemia despite taking oral iron and was started on Venofer. She has a history of laparoscopy gastric bypass surgery after her second pregnancy and prior history of iron deficiency anemia as well. She is due to deliver on 07/16/2016.    Interval history- Patient is now [redacted] weeks pregnant. She has received 5 doses of venofer so far and has tolerated it well. Reports energy levels are better. Pregnancy is proceeding uneventfully    Review of systems- Review of Systems  Constitutional: Negative for chills, fever, malaise/fatigue and weight loss.  HENT: Negative for congestion, ear discharge and nosebleeds.   Eyes: Negative for blurred vision.  Respiratory: Negative for cough, hemoptysis, sputum production, shortness of breath and wheezing.   Cardiovascular: Negative for chest pain, palpitations, orthopnea and claudication.  Gastrointestinal: Negative for abdominal pain, blood in stool, constipation, diarrhea, heartburn, melena, nausea and vomiting.  Genitourinary: Negative for dysuria, flank pain, frequency, hematuria and urgency.  Musculoskeletal: Negative for Pearson pain, joint pain and myalgias.  Skin: Negative for rash.  Neurological: Negative for dizziness, tingling, focal weakness, seizures, weakness and headaches.  Endo/Heme/Allergies: Does not bruise/bleed easily.  Psychiatric/Behavioral:  Negative for depression and suicidal ideas. The patient does not have insomnia.      Current treatment- IV iron  Allergies  Allergen Reactions  . Butorphanol Tartrate Nausea And Vomiting     Past Medical History:  Diagnosis Date  . Anemia   . Asthma   . Sleep apnea      Past Surgical History:  Procedure Laterality Date  . ABDOMINAL SURGERY  aug 2015   gastric bypass  . CHOLECYSTECTOMY    . GASTRIC BYPASS  aug 2015  . HERNIA REPAIR  aug 2015  . LAPAROSCOPIC APPENDECTOMY N/A 11/02/2015   Procedure: APPENDECTOMY LAPAROSCOPIC;  Surgeon: Tiney Rouge III, MD;  Location: [facility] ORS;  Service: General;  Laterality: N/A;  . LAPAROSCOPIC UNILATERAL SALPINGECTOMY Right 11/02/2015   Procedure: LAPAROSCOPIC RIGHT PARTIAL SALPINGECTOMY;  Surgeon: Tiney Rouge III, MD;  Location: [facility] ORS;  Service: General;  Laterality: Right;  Performed by Dr. Bonney Aid     Social History   Social History  . Marital status: Single    Spouse name: N/A  . Number of children: N/A  . Years of education: N/A   Occupational History  . Not on file.   Social History Main Topics  . Smoking status: Never Smoker  . Smokeless tobacco: Never Used  . Alcohol use No     Comment: social  . Drug use: No  . Sexual activity: Yes   Other Topics Concern  . Not on file   Social History Narrative  . No narrative on file    No family history on file.   Current Outpatient Prescriptions:  .  acidophilus (RISAQUAD) CAPS capsule, Take 1 capsule by mouth daily., Disp: , Rfl:  .  albuterol (PROVENTIL HFA;VENTOLIN HFA) 108 (90 Base) MCG/ACT inhaler, Inhale 2 puffs into the lungs  every 4 (four) hours as needed for wheezing or shortness of breath., Disp: , Rfl:  .  ferrous sulfate 325 (65 FE) MG EC tablet, Take by mouth., Disp: , Rfl:  .  Multiple Vitamin (MULTI-VITAMINS) TABS, Take 1 tablet by mouth daily., Disp: , Rfl:  .  Prenatal Vit-Fe Fumarate-FA (PRENATAL MULTIVITAMIN) TABS tablet, Take 1 tablet by mouth daily at  12 noon., Disp: , Rfl:   Physical exam:  Vitals:   03/26/16 1015  BP: 125/75  Pulse: 62  Resp: 18  Temp: (!) 95.8 F (35.4 C)  TempSrc: Tympanic  Weight: 221 lb 9 oz (100.5 kg)   Physical Exam  Constitutional: She is oriented to person, place, and time and well-developed, well-nourished, and in no distress.  HENT:  Head: Normocephalic and atraumatic.  Eyes: EOM are normal. Pupils are equal, round, and reactive to light.  Neck: Normal range of motion.  Cardiovascular: Normal rate, regular rhythm and normal heart sounds.   Pulmonary/Chest: Effort normal and breath sounds normal.  Abdominal:  Gravid uterus  Neurological: She is alert and oriented to person, place, and time.  Skin: Skin is warm and dry.     CMP Latest Ref Rng & Units 11/14/2015  Glucose 65 - 99 mg/dL 92  BUN 6 - 20 mg/dL 10  Creatinine 1.610.44 - 0.961.00 mg/dL 0.450.70  Sodium 409135 - 811145 mmol/L 139  Potassium 3.5 - 5.1 mmol/L 3.4(L)  Chloride 101 - 111 mmol/L 110  CO2 22 - 32 mmol/L 24  Calcium 8.9 - 10.3 mg/dL 9.1(Y8.7(L)  Total Protein 6.5 - 8.1 g/dL 7.2  Total Bilirubin 0.3 - 1.2 mg/dL 0.6  Alkaline Phos 38 - 126 U/L 110  AST 15 - 41 U/L 29  ALT 14 - 54 U/L 41   CBC Latest Ref Rng & Units 03/26/2016  WBC 3.6 - 11.0 K/uL 6.9  Hemoglobin 12.0 - 16.0 g/dL 10.6(L)  Hematocrit 35.0 - 47.0 % 31.2(L)  Platelets 150 - 440 K/uL 174      Assessment and plan- Patient is a 31 y.o. female with a history of iron deficiency anemia secondary to prior gastric bypass  1. Patient has received 4 doses of Venofer so far. Last dose was on 02/27/2016. Anemia has not improved significantly but her microcytosis has resolved. I do feel her H/H is lagging behnd her her MCV and should improve on its own. Iron studies are pending from today. We will call her if it is still low and if she needs more doses. Otherwise she will get repeat cbc in1 month and I will see her in 2 months with cbc and iron studies.   Visit Diagnosis 1. Iron deficiency  anemia secondary to inadequate dietary iron intake      Dr. Owens SharkArchana Rao, MD, MPH [facility] at [facility] Pager- [phone] 03/26/2016 10:45 AM

## 2016-03-26 NOTE — Progress Notes (Signed)
Here for follow up feeling well. Stated more energy since last iron infusion Jan 5/17. Pt is 6 mo pregnant . In good spirits.

## 2016-04-23 ENCOUNTER — Inpatient Hospital Stay: Payer: PRIVATE HEALTH INSURANCE | Attending: Oncology

## 2016-04-23 DIAGNOSIS — D509 Iron deficiency anemia, unspecified: Secondary | ICD-10-CM | POA: Insufficient documentation

## 2016-04-23 DIAGNOSIS — J45909 Unspecified asthma, uncomplicated: Secondary | ICD-10-CM | POA: Insufficient documentation

## 2016-04-23 DIAGNOSIS — Z3A24 24 weeks gestation of pregnancy: Secondary | ICD-10-CM | POA: Insufficient documentation

## 2016-04-23 DIAGNOSIS — O99012 Anemia complicating pregnancy, second trimester: Secondary | ICD-10-CM | POA: Insufficient documentation

## 2016-04-23 DIAGNOSIS — Z79899 Other long term (current) drug therapy: Secondary | ICD-10-CM | POA: Insufficient documentation

## 2016-04-23 DIAGNOSIS — G4730 Sleep apnea, unspecified: Secondary | ICD-10-CM | POA: Insufficient documentation

## 2016-04-23 DIAGNOSIS — Z9884 Bariatric surgery status: Secondary | ICD-10-CM | POA: Insufficient documentation

## 2016-05-03 ENCOUNTER — Inpatient Hospital Stay: Payer: PRIVATE HEALTH INSURANCE | Attending: Oncology

## 2016-05-07 ENCOUNTER — Other Ambulatory Visit: Payer: Self-pay | Admitting: Oncology

## 2016-05-07 ENCOUNTER — Inpatient Hospital Stay: Payer: PRIVATE HEALTH INSURANCE

## 2016-05-07 ENCOUNTER — Telehealth: Payer: Self-pay | Admitting: *Deleted

## 2016-05-07 DIAGNOSIS — D509 Iron deficiency anemia, unspecified: Secondary | ICD-10-CM | POA: Diagnosis not present

## 2016-05-07 DIAGNOSIS — Z3A24 24 weeks gestation of pregnancy: Secondary | ICD-10-CM | POA: Diagnosis not present

## 2016-05-07 DIAGNOSIS — G4730 Sleep apnea, unspecified: Secondary | ICD-10-CM | POA: Diagnosis not present

## 2016-05-07 DIAGNOSIS — Z79899 Other long term (current) drug therapy: Secondary | ICD-10-CM | POA: Diagnosis not present

## 2016-05-07 DIAGNOSIS — J45909 Unspecified asthma, uncomplicated: Secondary | ICD-10-CM | POA: Diagnosis not present

## 2016-05-07 DIAGNOSIS — Z9884 Bariatric surgery status: Secondary | ICD-10-CM | POA: Diagnosis not present

## 2016-05-07 DIAGNOSIS — O99012 Anemia complicating pregnancy, second trimester: Secondary | ICD-10-CM | POA: Diagnosis not present

## 2016-05-07 LAB — CBC
HCT: 30.7 % — ABNORMAL LOW (ref 35.0–47.0)
Hemoglobin: 10.6 g/dL — ABNORMAL LOW (ref 12.0–16.0)
MCH: 29.5 pg (ref 26.0–34.0)
MCHC: 34.5 g/dL (ref 32.0–36.0)
MCV: 85.5 fL (ref 80.0–100.0)
PLATELETS: 190 10*3/uL (ref 150–440)
RBC: 3.59 MIL/uL — ABNORMAL LOW (ref 3.80–5.20)
RDW: 15.3 % — ABNORMAL HIGH (ref 11.5–14.5)
WBC: 9 10*3/uL (ref 3.6–11.0)

## 2016-05-07 NOTE — Telephone Encounter (Signed)
Called patient to let her know her labs indicated her iron stores are low.  She will need more iron.  MD recommends IV venofer weekly X 4 weeks.  Informed her that scheduling will contact her on Monday to set up appointments.  Patient verbalized understanding.

## 2016-05-12 ENCOUNTER — Inpatient Hospital Stay: Payer: PRIVATE HEALTH INSURANCE

## 2016-05-12 VITALS — BP 118/76 | HR 93 | Temp 97.7°F | Resp 20

## 2016-05-12 DIAGNOSIS — D508 Other iron deficiency anemias: Secondary | ICD-10-CM

## 2016-05-12 DIAGNOSIS — O99011 Anemia complicating pregnancy, first trimester: Secondary | ICD-10-CM

## 2016-05-12 DIAGNOSIS — O99012 Anemia complicating pregnancy, second trimester: Secondary | ICD-10-CM | POA: Diagnosis not present

## 2016-05-12 MED ORDER — SODIUM CHLORIDE 0.9 % IV SOLN
Freq: Once | INTRAVENOUS | Status: AC
  Administered 2016-05-12: 16:00:00 via INTRAVENOUS
  Filled 2016-05-12: qty 1000

## 2016-05-12 MED ORDER — IRON SUCROSE 20 MG/ML IV SOLN
200.0000 mg | Freq: Once | INTRAVENOUS | Status: AC
  Administered 2016-05-12: 200 mg via INTRAVENOUS
  Filled 2016-05-12: qty 10

## 2016-05-13 ENCOUNTER — Ambulatory Visit: Payer: PRIVATE HEALTH INSURANCE

## 2016-05-19 ENCOUNTER — Inpatient Hospital Stay: Payer: PRIVATE HEALTH INSURANCE

## 2016-05-19 VITALS — BP 107/71 | HR 61 | Temp 97.1°F | Resp 18

## 2016-05-19 DIAGNOSIS — D508 Other iron deficiency anemias: Secondary | ICD-10-CM

## 2016-05-19 DIAGNOSIS — O99011 Anemia complicating pregnancy, first trimester: Secondary | ICD-10-CM

## 2016-05-19 DIAGNOSIS — O99012 Anemia complicating pregnancy, second trimester: Secondary | ICD-10-CM | POA: Diagnosis not present

## 2016-05-19 MED ORDER — SODIUM CHLORIDE 0.9 % IV SOLN: 200.0000 mg | Freq: Once | INTRAVENOUS | Status: DC

## 2016-05-19 MED ORDER — SODIUM CHLORIDE 0.9 % IV SOLN
Freq: Once | INTRAVENOUS | Status: AC
  Administered 2016-05-19: 16:00:00 via INTRAVENOUS
  Filled 2016-05-19: qty 1000

## 2016-05-19 MED ORDER — IRON SUCROSE 20 MG/ML IV SOLN
200.0000 mg | Freq: Once | INTRAVENOUS | Status: AC
  Administered 2016-05-19: 200 mg via INTRAVENOUS
  Filled 2016-05-19: qty 10

## 2016-05-25 ENCOUNTER — Inpatient Hospital Stay: Payer: PRIVATE HEALTH INSURANCE | Admitting: Oncology

## 2016-05-25 ENCOUNTER — Inpatient Hospital Stay: Payer: PRIVATE HEALTH INSURANCE

## 2016-05-26 ENCOUNTER — Inpatient Hospital Stay: Payer: PRIVATE HEALTH INSURANCE | Attending: Oncology

## 2016-05-26 VITALS — BP 111/71 | HR 65 | Temp 97.0°F | Resp 18

## 2016-05-26 DIAGNOSIS — D509 Iron deficiency anemia, unspecified: Secondary | ICD-10-CM | POA: Insufficient documentation

## 2016-05-26 DIAGNOSIS — G4730 Sleep apnea, unspecified: Secondary | ICD-10-CM | POA: Diagnosis not present

## 2016-05-26 DIAGNOSIS — O99842 Bariatric surgery status complicating pregnancy, second trimester: Secondary | ICD-10-CM | POA: Diagnosis not present

## 2016-05-26 DIAGNOSIS — J45909 Unspecified asthma, uncomplicated: Secondary | ICD-10-CM | POA: Insufficient documentation

## 2016-05-26 DIAGNOSIS — O99011 Anemia complicating pregnancy, first trimester: Secondary | ICD-10-CM

## 2016-05-26 DIAGNOSIS — Z79899 Other long term (current) drug therapy: Secondary | ICD-10-CM | POA: Diagnosis not present

## 2016-05-26 DIAGNOSIS — Z3A18 18 weeks gestation of pregnancy: Secondary | ICD-10-CM | POA: Diagnosis not present

## 2016-05-26 DIAGNOSIS — D508 Other iron deficiency anemias: Secondary | ICD-10-CM

## 2016-05-26 MED ORDER — IRON SUCROSE 20 MG/ML IV SOLN
200.0000 mg | Freq: Once | INTRAVENOUS | Status: AC
  Administered 2016-05-26: 200 mg via INTRAVENOUS
  Filled 2016-05-26: qty 10

## 2016-05-26 MED ORDER — SODIUM CHLORIDE 0.9 % IV SOLN
Freq: Once | INTRAVENOUS | Status: AC
  Administered 2016-05-26: 16:00:00 via INTRAVENOUS
  Filled 2016-05-26: qty 1000

## 2016-06-02 ENCOUNTER — Inpatient Hospital Stay: Payer: PRIVATE HEALTH INSURANCE

## 2016-06-02 VITALS — BP 106/70 | HR 70 | Resp 20

## 2016-06-02 DIAGNOSIS — O99011 Anemia complicating pregnancy, first trimester: Secondary | ICD-10-CM

## 2016-06-02 DIAGNOSIS — O99842 Bariatric surgery status complicating pregnancy, second trimester: Secondary | ICD-10-CM | POA: Diagnosis not present

## 2016-06-02 DIAGNOSIS — D508 Other iron deficiency anemias: Secondary | ICD-10-CM

## 2016-06-02 MED ORDER — IRON SUCROSE 20 MG/ML IV SOLN
200.0000 mg | Freq: Once | INTRAVENOUS | Status: AC
  Administered 2016-06-02: 200 mg via INTRAVENOUS
  Filled 2016-06-02: qty 10

## 2016-06-02 MED ORDER — SODIUM CHLORIDE 0.9 % IV SOLN: 200.0000 mg | Freq: Once | INTRAVENOUS | Status: DC

## 2016-06-02 MED ORDER — SODIUM CHLORIDE 0.9 % IV SOLN
Freq: Once | INTRAVENOUS | Status: AC
  Administered 2016-06-02: 16:00:00 via INTRAVENOUS
  Filled 2016-06-02: qty 1000

## 2016-06-08 ENCOUNTER — Inpatient Hospital Stay (HOSPITAL_BASED_OUTPATIENT_CLINIC_OR_DEPARTMENT_OTHER): Payer: PRIVATE HEALTH INSURANCE | Admitting: Oncology

## 2016-06-08 ENCOUNTER — Inpatient Hospital Stay: Payer: PRIVATE HEALTH INSURANCE

## 2016-06-08 ENCOUNTER — Encounter: Payer: Self-pay | Admitting: Oncology

## 2016-06-08 VITALS — BP 133/83 | HR 62

## 2016-06-08 VITALS — BP 122/78 | HR 63 | Temp 95.3°F | Resp 18 | Wt 228.0 lb

## 2016-06-08 DIAGNOSIS — O99842 Bariatric surgery status complicating pregnancy, second trimester: Secondary | ICD-10-CM

## 2016-06-08 DIAGNOSIS — D508 Other iron deficiency anemias: Secondary | ICD-10-CM

## 2016-06-08 DIAGNOSIS — O99011 Anemia complicating pregnancy, first trimester: Secondary | ICD-10-CM

## 2016-06-08 DIAGNOSIS — Z3A18 18 weeks gestation of pregnancy: Secondary | ICD-10-CM

## 2016-06-08 DIAGNOSIS — D509 Iron deficiency anemia, unspecified: Secondary | ICD-10-CM | POA: Diagnosis not present

## 2016-06-08 DIAGNOSIS — Z79899 Other long term (current) drug therapy: Secondary | ICD-10-CM

## 2016-06-08 LAB — CBC
HCT: 29.7 % — ABNORMAL LOW (ref 35.0–47.0)
Hemoglobin: 10.7 g/dL — ABNORMAL LOW (ref 12.0–16.0)
MCH: 31.5 pg (ref 26.0–34.0)
MCHC: 35.9 g/dL (ref 32.0–36.0)
MCV: 87.8 fL (ref 80.0–100.0)
PLATELETS: 171 10*3/uL (ref 150–440)
RBC: 3.38 MIL/uL — AB (ref 3.80–5.20)
RDW: 15.1 % — ABNORMAL HIGH (ref 11.5–14.5)
WBC: 9.4 10*3/uL (ref 3.6–11.0)

## 2016-06-08 LAB — IRON AND TIBC
Iron: 92 ug/dL (ref 28–170)
SATURATION RATIOS: 25 % (ref 10.4–31.8)
TIBC: 372 ug/dL (ref 250–450)
UIBC: 280 ug/dL

## 2016-06-08 LAB — FERRITIN: FERRITIN: 104 ng/mL (ref 11–307)

## 2016-06-08 MED ORDER — SODIUM CHLORIDE 0.9 % IV SOLN
Freq: Once | INTRAVENOUS | Status: AC
  Administered 2016-06-08: 12:00:00 via INTRAVENOUS
  Filled 2016-06-08: qty 1000

## 2016-06-08 MED ORDER — SODIUM CHLORIDE 0.9 % IV SOLN: 200.0000 mg | Freq: Once | INTRAVENOUS | Status: DC

## 2016-06-08 MED ORDER — IRON SUCROSE 20 MG/ML IV SOLN
200.0000 mg | Freq: Once | INTRAVENOUS | Status: AC
  Administered 2016-06-08: 200 mg via INTRAVENOUS
  Filled 2016-06-08: qty 10

## 2016-06-08 NOTE — Progress Notes (Signed)
Patient offers no complaints today.  Wants to discuss Hgb. Levels.

## 2016-06-08 NOTE — Progress Notes (Signed)
Hematology/Oncology Consult note [facility]  [phone] [phone]  Patient Care Team: Kandyce Rud, MD as PCP - General (Family Medicine)   Name of the patient: Brooke Pearson  [id number]  [birth year]   Date of visit: 06/08/16  Diagnosis- iron deficiency anemia likely secondary to prior gastric bypass. Patient is currently pregnant  Chief complaint/ Reason for visit- routine f/u  Heme/Onc history: Patient is a 31 year old female who is currently at 18 weeks of gestation. She is G3P2L2. No problems with prior pregnancies. She continued to have anemia despite taking oral iron and was started on Venofer. She has a history of laparoscopy gastric bypass surgeryafter her second pregnancyand prior history of iron deficiency anemia as well. She is due to deliver on05/25/2018. She has received 5 doses of venofer so far which improved her iron stores but no improvement in her H/H over last 2 months  Interval history- reports mild fatigue. Denies other complaints   Review of systems- Review of Systems  Constitutional: Negative for chills, fever, malaise/fatigue and weight loss.  HENT: Negative for congestion, ear discharge and nosebleeds.   Eyes: Negative for blurred vision.  Respiratory: Negative for cough, hemoptysis, sputum production, shortness of breath and wheezing.   Cardiovascular: Negative for chest pain, palpitations, orthopnea and claudication.  Gastrointestinal: Negative for abdominal pain, blood in stool, constipation, diarrhea, heartburn, melena, nausea and vomiting.  Genitourinary: Negative for dysuria, flank pain, frequency, hematuria and urgency.  Musculoskeletal: Negative for back pain, joint pain and myalgias.  Skin: Negative for rash.  Neurological: Negative for dizziness, tingling, focal weakness, seizures, weakness and headaches.  Endo/Heme/Allergies: Does not bruise/bleed easily.  Psychiatric/Behavioral: Negative for  depression and suicidal ideas. The patient does not have insomnia.      Current treatment- IV iron  Allergies  Allergen Reactions  . Butorphanol Tartrate Nausea And Vomiting     Past Medical History:  Diagnosis Date  . Anemia   . Asthma   . Sleep apnea      Past Surgical History:  Procedure Laterality Date  . ABDOMINAL SURGERY  aug 2015   gastric bypass  . CHOLECYSTECTOMY    . GASTRIC BYPASS  aug 2015  . HERNIA REPAIR  aug 2015  . LAPAROSCOPIC APPENDECTOMY N/A 11/02/2015   Procedure: APPENDECTOMY LAPAROSCOPIC;  Surgeon: Tiney Rouge III, MD;  Location: [facility] ORS;  Service: General;  Laterality: N/A;  . LAPAROSCOPIC UNILATERAL SALPINGECTOMY Right 11/02/2015   Procedure: LAPAROSCOPIC RIGHT PARTIAL SALPINGECTOMY;  Surgeon: Tiney Rouge III, MD;  Location: [facility] ORS;  Service: General;  Laterality: Right;  Performed by Dr. Bonney Aid     Social History   Social History  . Marital status: Single    Spouse name: N/A  . Number of children: N/A  . Years of education: N/A   Occupational History  . Not on file.   Social History Main Topics  . Smoking status: Never Smoker  . Smokeless tobacco: Never Used  . Alcohol use No     Comment: social  . Drug use: No  . Sexual activity: Yes   Other Topics Concern  . Not on file   Social History Narrative  . No narrative on file    No family history on file.   Current Outpatient Prescriptions:  .  albuterol (PROVENTIL HFA;VENTOLIN HFA) 108 (90 Base) MCG/ACT inhaler, Inhale 2 puffs into the lungs every 4 (four) hours as needed for wheezing or shortness of breath., Disp: , Rfl:  .  Multiple Vitamin (MULTI-VITAMINS)  TABS, Take 1 tablet by mouth daily., Disp: , Rfl:  .  Prenatal Vit-Fe Fumarate-FA (PRENATAL MULTIVITAMIN) TABS tablet, Take 1 tablet by mouth daily at 12 noon., Disp: , Rfl:  .  acidophilus (RISAQUAD) CAPS capsule, Take 1 capsule by mouth daily., Disp: , Rfl:  .  ferrous sulfate 325 (65 FE) MG EC tablet, Take by mouth.,  Disp: , Rfl:  .  PULMICORT FLEXHALER 90 MCG/ACT inhaler, Inhale 90 mcg into the lungs 2 (two) times daily., Disp: , Rfl: 12  Physical exam:  Vitals:   06/08/16 1048  BP: 122/78  Pulse: 63  Resp: 18  Temp: (!) 95.3 F (35.2 C)  TempSrc: Tympanic  Weight: 228 lb (103.4 kg)   Physical Exam  Constitutional: She is oriented to person, place, and time and well-developed, well-nourished, and in no distress.  HENT:  Head: Normocephalic and atraumatic.  Eyes: EOM are normal. Pupils are equal, round, and reactive to light.  Neck: Normal range of motion.  Cardiovascular: Normal rate, regular rhythm and normal heart sounds.   Pulmonary/Chest: Effort normal and breath sounds normal.  Abdominal: Soft. Bowel sounds are normal.  Gravid uterus  Neurological: She is alert and oriented to person, place, and time.  Skin: Skin is warm and dry.     CMP Latest Ref Rng & Units 11/14/2015  Glucose 65 - 99 mg/dL 92  BUN 6 - 20 mg/dL 10  Creatinine 1.61 - 0.96 mg/dL 0.45  Sodium 409 - 811 mmol/L 139  Potassium 3.5 - 5.1 mmol/L 3.4(L)  Chloride 101 - 111 mmol/L 110  CO2 22 - 32 mmol/L 24  Calcium 8.9 - 10.3 mg/dL 9.1(Y)  Total Protein 6.5 - 8.1 g/dL 7.2  Total Bilirubin 0.3 - 1.2 mg/dL 0.6  Alkaline Phos 38 - 126 U/L 110  AST 15 - 41 U/L 29  ALT 14 - 54 U/L 41   CBC Latest Ref Rng & Units 06/08/2016  WBC 3.6 - 11.0 K/uL 9.4  Hemoglobin 12.0 - 16.0 g/dL 10.7(L)  Hematocrit 35.0 - 47.0 % 29.7(L)  Platelets 150 - 440 K/uL 171     Assessment and plan- Patient is a 31 y.o. female who is currently pregnant referred to [place] for iron deficiency anemia  Patient had definite evidence of iron deficiency anemia in the past and her H/H improved from 8.5 in dec 2017 to 10.6 at present. It hasnt changed significantly since December despite IV iron. She is due in about 4 weeks time. Ferritin was low from previous labs although other iron indices were better. I will proceed with 1 dose of venofer today. If iron  studies and ferritin are normal, will hold off on further doses. It would be ok to give her transfusion if required from [facility] perspective prior to delivery. No indication for transfusion today. I will see her 6 weeks post delivery and if she is still anemic to look into other causes of anemia at that time   Visit Diagnosis 1. Iron deficiency anemia, unspecified iron deficiency anemia type      Dr. Owens Shark, MD, MPH [facility] Pager- [phone] 06/08/2016 12:53 PM

## 2016-06-13 ENCOUNTER — Other Ambulatory Visit: Payer: Self-pay | Admitting: *Deleted

## 2016-06-13 DIAGNOSIS — D508 Other iron deficiency anemias: Secondary | ICD-10-CM

## 2016-06-15 ENCOUNTER — Ambulatory Visit: Payer: PRIVATE HEALTH INSURANCE

## 2016-06-22 ENCOUNTER — Ambulatory Visit: Payer: PRIVATE HEALTH INSURANCE

## 2016-07-01 ENCOUNTER — Encounter: Payer: Self-pay | Admitting: *Deleted

## 2016-07-01 ENCOUNTER — Inpatient Hospital Stay
Admission: EM | Admit: 2016-07-01 | Discharge: 2016-07-02 | DRG: 775 | Disposition: A | Discharge status: Home / Self Care | Payer: Medicaid Other | Source: Ambulatory Visit | Attending: Obstetrics and Gynecology | Admitting: Obstetrics and Gynecology

## 2016-07-01 ENCOUNTER — Encounter: Payer: Self-pay | Admitting: Certified Registered Nurse Anesthetist

## 2016-07-01 DIAGNOSIS — J4520 Mild intermittent asthma, uncomplicated: Secondary | ICD-10-CM | POA: Diagnosis present

## 2016-07-01 DIAGNOSIS — Z3A37 37 weeks gestation of pregnancy: Secondary | ICD-10-CM | POA: Diagnosis not present

## 2016-07-01 DIAGNOSIS — O9952 Diseases of the respiratory system complicating childbirth: Secondary | ICD-10-CM | POA: Diagnosis present

## 2016-07-01 DIAGNOSIS — O9902 Anemia complicating childbirth: Secondary | ICD-10-CM | POA: Diagnosis present

## 2016-07-01 DIAGNOSIS — Z3493 Encounter for supervision of normal pregnancy, unspecified, third trimester: Secondary | ICD-10-CM | POA: Diagnosis present

## 2016-07-01 DIAGNOSIS — D509 Iron deficiency anemia, unspecified: Secondary | ICD-10-CM | POA: Diagnosis present

## 2016-07-01 DIAGNOSIS — O99844 Bariatric surgery status complicating childbirth: Secondary | ICD-10-CM | POA: Diagnosis present

## 2016-07-01 DIAGNOSIS — O0993 Supervision of high risk pregnancy, unspecified, third trimester: Secondary | ICD-10-CM

## 2016-07-01 DIAGNOSIS — O99011 Anemia complicating pregnancy, first trimester: Secondary | ICD-10-CM

## 2016-07-01 DIAGNOSIS — Z3490 Encounter for supervision of normal pregnancy, unspecified, unspecified trimester: Secondary | ICD-10-CM

## 2016-07-01 DIAGNOSIS — O479 False labor, unspecified: Secondary | ICD-10-CM | POA: Diagnosis present

## 2016-07-01 LAB — CBC
HCT: 31.4 % — ABNORMAL LOW (ref 35.0–47.0)
HEMOGLOBIN: 11.4 g/dL — AB (ref 12.0–16.0)
MCH: 32.1 pg (ref 26.0–34.0)
MCHC: 36.3 g/dL — AB (ref 32.0–36.0)
MCV: 88.5 fL (ref 80.0–100.0)
Platelets: 166 10*3/uL (ref 150–440)
RBC: 3.54 MIL/uL — AB (ref 3.80–5.20)
RDW: 14.4 % (ref 11.5–14.5)
WBC: 13.5 10*3/uL — AB (ref 3.6–11.0)

## 2016-07-01 LAB — TYPE AND SCREEN
ABO/RH(D): O POS
ANTIBODY SCREEN: NEGATIVE

## 2016-07-01 MED ORDER — ONDANSETRON HCL 4 MG/2ML IJ SOLN: 4.0000 mg | Freq: Four times a day (QID) | INTRAMUSCULAR | Status: DC | PRN

## 2016-07-01 MED ORDER — MEPERIDINE HCL 25 MG/ML IJ SOLN: 50.0000 mg | INTRAMUSCULAR | Status: DC | PRN

## 2016-07-01 MED ORDER — SODIUM CHLORIDE 0.9% FLUSH: 3.0000 mL | INTRAVENOUS | Status: DC | PRN

## 2016-07-01 MED ORDER — OXYTOCIN 40 UNITS IN LACTATED RINGERS INFUSION - SIMPLE MED
2.5000 [IU]/h | INTRAVENOUS | Status: DC
  Filled 2016-07-01: qty 1000

## 2016-07-01 MED ORDER — LIDOCAINE HCL (PF) 1 % IJ SOLN: 30.0000 mL | INTRAMUSCULAR | Status: DC | PRN

## 2016-07-01 MED ORDER — ACETAMINOPHEN 325 MG PO TABS
650.0000 mg | ORAL_TABLET | ORAL | Status: DC | PRN
  Administered 2016-07-01: 650 mg via ORAL

## 2016-07-01 MED ORDER — ACETAMINOPHEN 325 MG PO TABS
650.0000 mg | ORAL_TABLET | ORAL | Status: DC | PRN
  Filled 2016-07-01: qty 2

## 2016-07-01 MED ORDER — AMMONIA AROMATIC IN INHA
RESPIRATORY_TRACT | Status: DC
Start: 2016-07-01 — End: 2016-07-01
  Filled 2016-07-01: qty 10

## 2016-07-01 MED ORDER — ACETAMINOPHEN 325 MG PO TABS
650.0000 mg | ORAL_TABLET | ORAL | Status: DC | PRN
  Administered 2016-07-01: 650 mg via ORAL
  Filled 2016-07-01: qty 2

## 2016-07-01 MED ORDER — OXYTOCIN BOLUS FROM INFUSION: 500.0000 mL | Freq: Once | INTRAVENOUS | Status: DC

## 2016-07-01 MED ORDER — PRENATAL MULTIVITAMIN CH
1.0000 | ORAL_TABLET | Freq: Every day | ORAL | Status: DC
Start: 2016-07-01 — End: 2016-07-02
  Administered 2016-07-02: 1 via ORAL
  Filled 2016-07-01: qty 1

## 2016-07-01 MED ORDER — SODIUM CHLORIDE 0.9 % IV SOLN: 250.0000 mL | INTRAVENOUS | Status: DC | PRN

## 2016-07-01 MED ORDER — ONDANSETRON HCL 4 MG PO TABS: 4.0000 mg | ORAL_TABLET | ORAL | Status: DC | PRN

## 2016-07-01 MED ORDER — FLEET ENEMA 7-19 GM/118ML RE ENEM: 1.0000 | ENEMA | Freq: Every day | RECTAL | Status: DC | PRN

## 2016-07-01 MED ORDER — ZOLPIDEM TARTRATE 5 MG PO TABS: 5.0000 mg | ORAL_TABLET | Freq: Every evening | ORAL | Status: DC | PRN

## 2016-07-01 MED ORDER — DIPHENHYDRAMINE HCL 25 MG PO CAPS
25.0000 mg | ORAL_CAPSULE | Freq: Four times a day (QID) | ORAL | Status: DC | PRN
Start: 2016-07-01 — End: 2016-07-02

## 2016-07-01 MED ORDER — LACTATED RINGERS IV SOLN
500.0000 mL | INTRAVENOUS | Status: DC | PRN
  Administered 2016-07-01: 500 mL via INTRAVENOUS

## 2016-07-01 MED ORDER — OXYTOCIN 10 UNIT/ML IJ SOLN
INTRAMUSCULAR | Status: AC
  Filled 2016-07-01: qty 2

## 2016-07-01 MED ORDER — LACTATED RINGERS IV SOLN: INTRAVENOUS | Status: DC

## 2016-07-01 MED ORDER — SOD CITRATE-CITRIC ACID 500-334 MG/5ML PO SOLN
30.0000 mL | ORAL | Status: DC | PRN
  Filled 2016-07-01: qty 30

## 2016-07-01 MED ORDER — LACTATED RINGERS IV SOLN
INTRAVENOUS | Status: DC
  Administered 2016-07-01: 04:00:00 via INTRAVENOUS

## 2016-07-01 MED ORDER — SODIUM CHLORIDE 0.9% FLUSH: 3.0000 mL | Freq: Two times a day (BID) | INTRAVENOUS | Status: DC

## 2016-07-01 MED ORDER — OXYTOCIN 40 UNITS IN LACTATED RINGERS INFUSION - SIMPLE MED
2.5000 [IU]/h | INTRAVENOUS | Status: DC
  Administered 2016-07-01: 2.5 [IU]/h via INTRAVENOUS

## 2016-07-01 MED ORDER — ONDANSETRON HCL 4 MG/2ML IJ SOLN: 4.0000 mg | INTRAMUSCULAR | Status: DC | PRN

## 2016-07-01 MED ORDER — MEPERIDINE HCL 25 MG/ML IJ SOLN
50.0000 mg | INTRAMUSCULAR | Status: DC | PRN
  Administered 2016-07-01: 50 mg via INTRAVENOUS
  Filled 2016-07-01: qty 2

## 2016-07-01 MED ORDER — MEASLES, MUMPS & RUBELLA VAC ~~LOC~~ INJ
0.5000 mL | INJECTION | Freq: Once | SUBCUTANEOUS | Status: DC
  Filled 2016-07-01: qty 0.5

## 2016-07-01 MED ORDER — BENZOCAINE-MENTHOL 20-0.5 % EX AERO: 1.0000 "application " | INHALATION_SPRAY | CUTANEOUS | Status: DC | PRN

## 2016-07-01 MED ORDER — LACTATED RINGERS IV SOLN: 500.0000 mL | INTRAVENOUS | Status: DC | PRN

## 2016-07-01 MED ORDER — TETANUS-DIPHTH-ACELL PERTUSSIS 5-2.5-18.5 LF-MCG/0.5 IM SUSP: 0.5000 mL | Freq: Once | INTRAMUSCULAR | Status: DC

## 2016-07-01 MED ORDER — BISACODYL 10 MG RE SUPP: 10.0000 mg | Freq: Every day | RECTAL | Status: DC | PRN

## 2016-07-01 MED ORDER — OXYTOCIN BOLUS FROM INFUSION
500.0000 mL | Freq: Once | INTRAVENOUS | Status: AC
  Administered 2016-07-01: 500 mL via INTRAVENOUS

## 2016-07-01 MED ORDER — COCONUT OIL OIL
1.0000 "application " | TOPICAL_OIL | Status: DC | PRN
  Administered 2016-07-01: 1 via TOPICAL
  Filled 2016-07-01: qty 120

## 2016-07-01 MED ORDER — SIMETHICONE 80 MG PO CHEW: 80.0000 mg | CHEWABLE_TABLET | ORAL | Status: DC | PRN

## 2016-07-01 MED ORDER — MISOPROSTOL 200 MCG PO TABS
ORAL_TABLET | ORAL | Status: AC
  Filled 2016-07-01: qty 4

## 2016-07-01 MED ORDER — IBUPROFEN 600 MG PO TABS
600.0000 mg | ORAL_TABLET | Freq: Four times a day (QID) | ORAL | Status: DC
  Administered 2016-07-01 – 2016-07-02 (×4): 600 mg via ORAL
  Filled 2016-07-01 (×4): qty 1

## 2016-07-01 MED ORDER — SOD CITRATE-CITRIC ACID 500-334 MG/5ML PO SOLN: 30.0000 mL | ORAL | Status: DC | PRN

## 2016-07-01 MED ORDER — LIDOCAINE HCL (PF) 1 % IJ SOLN
INTRAMUSCULAR | Status: AC
  Filled 2016-07-01: qty 30

## 2016-07-01 MED ORDER — SENNOSIDES-DOCUSATE SODIUM 8.6-50 MG PO TABS
2.0000 | ORAL_TABLET | ORAL | Status: DC
  Filled 2016-07-01: qty 2

## 2016-07-01 MED ORDER — WITCH HAZEL-GLYCERIN EX PADS: 1.0000 "application " | MEDICATED_PAD | CUTANEOUS | Status: DC | PRN

## 2016-07-01 MED ORDER — DIBUCAINE 1 % RE OINT: 1.0000 "application " | TOPICAL_OINTMENT | RECTAL | Status: DC | PRN

## 2016-07-01 NOTE — OB Triage Note (Signed)
Recvd pt from ED @ 0150. EFM applied. Pt c/o contractions every 4-6 min that started last night around 2000. Said she had an appt in the office and was 3-3.5cm. Pt rates her pain a 7 out of 10. 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 Feeling baby move ok.

## 2016-07-01 NOTE — Discharge Summary (Signed)
Obstetric Discharge Summary   Patient ID: Patient Name: Brooke Pearson DOB: 05-26-1985 MRN: [id number]  Date of Admission: 07/01/2016 Date of Discharge: 07/02/2016   Gestational Age at Delivery: 5718w6d   Antepartum complications: surgery early pregnancy  Admitting Diagnosis: active labor  Secondary Diagnoses: Patient Active Problem List   Diagnosis Date Noted  . Pregnancy 07/01/2016  . Uterine contractions during pregnancy 07/01/2016  . Iron deficiency anemia secondary to inadequate dietary iron intake 12/24/2015  . Anemia, antepartum, first trimester 12/18/2015  . Torsion of fallopian tube 11/02/2015  . 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 Well woman exam with routine gynecological exam 12/09/2014  . Asthma, mild intermittent, well-controlled 11/29/2014  . Morbid obesity (HCC) 09/25/2013    Augmentation: None Complications: None Intrapartum complications/course: uncomplicated labor , rapid second stage from 8 to delivery   Date of Delivery: 07/01/16 at 0707 Delivered By: Schermerhorn Delivery Type: SVD Anesthesia: epidural Placenta: sponatneous Laceration: none Episiotomy: none  Newborn Data: Live born Female Birth Weight:   APGAR: , 8/9      Postpartum Course  Patient had an uncomplicated postpartum course.  By time of discharge on PPD#1, her pain was controlled on oral pain medications; she had appropriate lochia and was ambulating, voiding without difficulty and tolerating regular diet.  She was deemed stable for discharge to home.     Labs: CBC Latest Ref Rng & Units 07/02/2016 07/01/2016 06/08/2016  WBC 3.6 - 11.0 K/uL 9.9 13.5(H) 9.4  Hemoglobin 12.0 - 16.0 g/dL 10.1(L) 11.4(L) 10.7(L)  Hematocrit 35.0 - 47.0 % 28.8(L) 31.4(L) 29.7(L)  Platelets 150 - 440 K/uL 138(L) 166 171   O POS  Physical exam:  BP 129/78 (BP Location: Left Arm)   Pulse 60   Temp 98.4 F (36.9 C) (Oral)   Resp 18   LMP 10/10/2015 Comment: pos preg  SpO2 98%    Breastfeeding? Unknown  General: alert and no distress Pulm: normal respiratory effort Lochia: appropriate Abdomen: soft, NT Uterine Fundus: firm, below umbilicus Extremities: No evidence of DVT seen on physical exam. No lower extremity edema.   Disposition: stable, discharge to home Baby Feeding: breastmilk Baby Disposition: home with mom   Plan:  Brooke Pearson was discharged to home in good condition. [facility] in 6 weeks  Discharge Instructions: Per After Visit Summary. Activity: Advance as tolerated. Pelvic rest for 6 weeks.  Refer to After Visit Summary Diet: Regular Discharge Medications: Allergies as of 07/02/2016      Reactions   Butorphanol Tartrate Nausea And Vomiting      Medication List    TAKE these medications   acidophilus Caps capsule Take 1 capsule by mouth daily.   albuterol 108 (90 Base) MCG/ACT inhaler Commonly known as:  PROVENTIL HFA;VENTOLIN HFA Inhale 2 puffs into the lungs every 4 (four) hours as needed for wheezing or shortness of breath.   ascorbic acid 250 MG tablet Commonly known as:  VITAMIN C Take 1 tablet (250 mg total) by mouth 2 (two) times daily with a meal. Take with iron for anemia   docusate sodium 100 MG capsule Commonly known as:  COLACE Take 1 capsule (100 mg total) by mouth daily as needed for mild constipation.   ferrous sulfate 325 (65 FE) MG EC tablet Take by mouth. What changed:  Another medication with the same name was added. Make sure you understand how and when to take each.   ferrous sulfate 325 (65 FE) MG tablet Take 1 tablet (  325 mg total) by mouth 2 (two) times daily with a meal. For anemia, take with Vitamin C What changed:  You were already taking a medication with the same name, and this prescription was added. Make sure you understand how and when to take each.   ibuprofen 800 MG tablet Commonly known as:  ADVIL,MOTRIN Take 1 tablet (800 mg total) by mouth every 8  (eight) hours as needed for moderate pain or cramping.   MULTI-VITAMINS Tabs Take 1 tablet by mouth daily.   prenatal multivitamin Tabs tablet Take 1 tablet by mouth daily at 12 noon.   PULMICORT FLEXHALER 90 MCG/ACT inhaler Generic drug:  Budesonide Inhale 90 mcg into the lungs 2 (two) times daily.      Outpatient follow up: 6wks with Dr. Feliberto Gottron  Signed:  Christeen Douglas

## 2016-07-01 NOTE — H&P (Signed)
Brooke Pearson is a 31 y.o. female presenting for active labor  OB History    Gravida Para Term Preterm AB Living   3         2   SAB TAB Ectopic Multiple Live Births                 Past Medical History:  Diagnosis Date  . Anemia   . Asthma   . Sleep apnea    Past Surgical History:  Procedure Laterality Date  . ABDOMINAL SURGERY  aug 2015   gastric bypass  . CHOLECYSTECTOMY    . GASTRIC BYPASS  aug 2015  . HERNIA REPAIR  aug 2015  . LAPAROSCOPIC APPENDECTOMY N/A 11/02/2015   Procedure: APPENDECTOMY LAPAROSCOPIC;  Surgeon: Tiney Rougealph Ely III, MD;  Location: [facility] ORS;  Service: General;  Laterality: N/A;  . LAPAROSCOPIC UNILATERAL SALPINGECTOMY Right 11/02/2015   Procedure: LAPAROSCOPIC RIGHT PARTIAL SALPINGECTOMY;  Surgeon: Tiney Rougealph Ely III, MD;  Location: [facility] ORS;  Service: General;  Laterality: Right;  Performed by Dr. Bonney AidStaebler    Family History: family history is not on file. Social History:  reports that she has never smoked. She has never used smokeless tobacco. She reports that she does not drink alcohol or use drugs.     Maternal Diabetes: No Genetic Screening: Declined Maternal Ultrasounds/Referrals: Normal Fetal Ultrasounds or other Referrals:  None Maternal Substance Abuse:  No Significant Maternal Medications:  None Significant Maternal Lab Results:  None Other Comments:  None  ROS History Dilation: 8 Effacement (%): 90 Station: +1 Exam by:: MBS Blood pressure 131/88, pulse 69, temperature 97.9 F (36.6 C), temperature source Oral, resp. rate 16, last menstrual period 10/10/2015. Exam Physical Exam  Lungs CTA  cv rrr  Abd soft nt EFw #8/0 adequate pelvis AROM acant clear   NST : reassuring  CTX q 3 min Prenatal labs: ABO, Rh: --/--/O POS (05/10 0430) Antibody: NEG (05/10 0430) Rubella:  imm RPR:   negHBsAg:    HIV:   neg GBS:   neg   Assessment/Plan: Active labor  Reassuring fetal monitoring  Pt requests CLE   Brooke Pearson  Brooke Pearson 07/01/2016, 6:55 AM

## 2016-07-02 LAB — CBC
HEMATOCRIT: 28.8 % — AB (ref 35.0–47.0)
HEMOGLOBIN: 10.1 g/dL — AB (ref 12.0–16.0)
MCH: 31.1 pg (ref 26.0–34.0)
MCHC: 35.1 g/dL (ref 32.0–36.0)
MCV: 88.8 fL (ref 80.0–100.0)
Platelets: 138 10*3/uL — ABNORMAL LOW (ref 150–440)
RBC: 3.25 MIL/uL — AB (ref 3.80–5.20)
RDW: 14.2 % (ref 11.5–14.5)
WBC: 9.9 10*3/uL (ref 3.6–11.0)

## 2016-07-02 LAB — RPR: RPR Ser Ql: NONREACTIVE

## 2016-07-02 MED ORDER — ASCORBIC ACID 250 MG PO TABS: 250.0000 mg | ORAL_TABLET | Freq: Two times a day (BID) | ORAL | 3 refills | Status: AC

## 2016-07-02 MED ORDER — FERROUS SULFATE 325 (65 FE) MG PO TABS: 325.0000 mg | ORAL_TABLET | Freq: Two times a day (BID) | ORAL | 3 refills | Status: DC

## 2016-07-02 MED ORDER — IBUPROFEN 800 MG PO TABS: 800.0000 mg | ORAL_TABLET | Freq: Three times a day (TID) | ORAL | 0 refills | Status: AC | PRN

## 2016-07-02 MED ORDER — DOCUSATE SODIUM 100 MG PO CAPS: 100.0000 mg | ORAL_CAPSULE | Freq: Every day | ORAL | 3 refills | Status: DC | PRN

## 2016-07-02 NOTE — Progress Notes (Signed)
Discharge instructions reviewed with patient.  All questions answered.  Patient discharge to home via wheelchair with spouse and baby.

## 2016-09-07 ENCOUNTER — Inpatient Hospital Stay: Payer: Medicaid Other | Attending: Oncology | Admitting: Oncology

## 2016-09-07 ENCOUNTER — Inpatient Hospital Stay: Payer: Medicaid Other

## 2016-09-07 VITALS — BP 135/95 | HR 69 | Temp 97.2°F | Resp 18 | Wt 210.9 lb

## 2016-09-07 DIAGNOSIS — J45909 Unspecified asthma, uncomplicated: Secondary | ICD-10-CM | POA: Diagnosis not present

## 2016-09-07 DIAGNOSIS — Z9884 Bariatric surgery status: Secondary | ICD-10-CM

## 2016-09-07 DIAGNOSIS — D508 Other iron deficiency anemias: Secondary | ICD-10-CM

## 2016-09-07 DIAGNOSIS — Z79899 Other long term (current) drug therapy: Secondary | ICD-10-CM | POA: Diagnosis not present

## 2016-09-07 DIAGNOSIS — G4730 Sleep apnea, unspecified: Secondary | ICD-10-CM | POA: Diagnosis not present

## 2016-09-07 DIAGNOSIS — D509 Iron deficiency anemia, unspecified: Secondary | ICD-10-CM | POA: Diagnosis present

## 2016-09-07 LAB — CBC
HCT: 34 % — ABNORMAL LOW (ref 35.0–47.0)
Hemoglobin: 11.8 g/dL — ABNORMAL LOW (ref 12.0–16.0)
MCH: 30.3 pg (ref 26.0–34.0)
MCHC: 34.7 g/dL (ref 32.0–36.0)
MCV: 87.2 fL (ref 80.0–100.0)
PLATELETS: 208 10*3/uL (ref 150–440)
RBC: 3.89 MIL/uL (ref 3.80–5.20)
RDW: 12.9 % (ref 11.5–14.5)
WBC: 5.2 10*3/uL (ref 3.6–11.0)

## 2016-09-07 LAB — IRON AND TIBC
IRON: 54 ug/dL (ref 28–170)
Saturation Ratios: 17 % (ref 10.4–31.8)
TIBC: 312 ug/dL (ref 250–450)
UIBC: 258 ug/dL

## 2016-09-07 LAB — FERRITIN: Ferritin: 34 ng/mL (ref 11–307)

## 2016-09-07 LAB — RETICULOCYTES
RBC.: 3.96 MIL/uL (ref 3.80–5.20)
RETIC COUNT ABSOLUTE: 35.6 10*3/uL (ref 19.0–183.0)
Retic Ct Pct: 0.9 % (ref 0.4–3.1)

## 2016-09-07 LAB — FOLATE: Folate: 10.2 ng/mL (ref >5.9)

## 2016-09-07 LAB — VITAMIN B12: Vitamin B-12: 687 pg/mL (ref 180–914)

## 2016-09-07 NOTE — Progress Notes (Signed)
Here for follow up. Repeat BP 137/88

## 2016-09-09 ENCOUNTER — Encounter: Payer: Self-pay | Admitting: Oncology

## 2016-09-09 NOTE — Progress Notes (Signed)
Hematology/Oncology Consult note [facility]  [phone] [phone]  Patient Care Team: Kandyce RudBabaoff, Marcus, MD as PCP - General (Family Medicine)   Name of the patient: Brooke Pearson  [id number]  1985/10/06   Date of visit: 09/09/16  Diagnosis- iron deficiency anemia  Chief complaint/ Reason for visit- routine f/u  Heme/Onc history: Patient is a 31 yr old female G3P3L3. She recently delivered her third child in early May 2018. Delivery was uneventful. She had seen me during her 3rd pregnancy for IDA and received IV iron  Interval history- doing well. Denies any complaints today  ECOG PS- 0 Pain scale- 0   Review of systems- Review of Systems  Constitutional: Negative for chills, fever, malaise/fatigue and weight loss.  HENT: Negative for congestion, ear discharge and nosebleeds.   Eyes: Negative for blurred vision.  Respiratory: Negative for cough, hemoptysis, sputum production, shortness of breath and wheezing.   Cardiovascular: Negative for chest pain, palpitations, orthopnea and claudication.  Gastrointestinal: Negative for abdominal pain, blood in stool, constipation, diarrhea, heartburn, melena, nausea and vomiting.  Genitourinary: Negative for dysuria, flank pain, frequency, hematuria and urgency.  Musculoskeletal: Negative for back pain, joint pain and myalgias.  Skin: Negative for rash.  Neurological: Negative for dizziness, tingling, focal weakness, seizures, weakness and headaches.  Endo/Heme/Allergies: Does not bruise/bleed easily.  Psychiatric/Behavioral: Negative for depression and suicidal ideas. The patient does not have insomnia.       Allergies  Allergen Reactions  . Butorphanol Tartrate Nausea And Vomiting  . Sulfa Antibiotics Rash     Past Medical History:  Diagnosis Date  . Anemia   . Asthma   . Sleep apnea      Past Surgical History:  Procedure Laterality Date  . ABDOMINAL SURGERY  aug 2015     gastric bypass  . CHOLECYSTECTOMY    . GASTRIC BYPASS  aug 2015  . HERNIA REPAIR  aug 2015  . LAPAROSCOPIC APPENDECTOMY N/A 11/02/2015   Procedure: APPENDECTOMY LAPAROSCOPIC;  Surgeon: Tiney Rougealph Ely III, MD;  Location: [facility] ORS;  Service: General;  Laterality: N/A;  . LAPAROSCOPIC UNILATERAL SALPINGECTOMY Right 11/02/2015   Procedure: LAPAROSCOPIC RIGHT PARTIAL SALPINGECTOMY;  Surgeon: Tiney Rougealph Ely III, MD;  Location: [facility] ORS;  Service: General;  Laterality: Right;  Performed by Dr. Bonney AidStaebler     Social History   Social History  . Marital status: Single    Spouse name: N/A  . Number of children: N/A  . Years of education: N/A   Occupational History  . Not on file.   Social History Main Topics  . Smoking status: Never Smoker  . Smokeless tobacco: Never Used  . Alcohol use No     Comment: social  . Drug use: No  . Sexual activity: Yes   Other Topics Concern  . Not on file   Social History Narrative  . No narrative on file    No family history on file.   Current Outpatient Prescriptions:  .  ferrous sulfate 325 (65 FE) MG EC tablet, Take by mouth., Disp: , Rfl:  .  Multiple Vitamin (MULTI-VITAMINS) TABS, Take 1 tablet by mouth daily., Disp: , Rfl:  .  Prenatal Vit-Fe Fumarate-FA (PRENATAL MULTIVITAMIN) TABS tablet, Take 1 tablet by mouth daily at 12 noon., Disp: , Rfl:  .  PULMICORT FLEXHALER 90 MCG/ACT inhaler, Inhale 90 mcg into the lungs 2 (two) times daily., Disp: , Rfl: 12 .  acidophilus (RISAQUAD) CAPS capsule, Take 1 capsule by mouth daily., Disp: ,  Rfl:  .  albuterol (PROVENTIL HFA;VENTOLIN HFA) 108 (90 Base) MCG/ACT inhaler, Inhale 2 puffs into the lungs every 4 (four) hours as needed for wheezing or shortness of breath., Disp: , Rfl:  .  docusate sodium (COLACE) 100 MG capsule, Take 1 capsule (100 mg total) by mouth daily as needed for mild constipation. (Patient not taking: Reported on 09/07/2016), Disp: 60 capsule, Rfl: 3 .  ferrous sulfate 325 (65 FE) MG tablet,  Take 1 tablet (325 mg total) by mouth 2 (two) times daily with a meal. For anemia, take with Vitamin C, Disp: 60 tablet, Rfl: 3  Physical exam:  Vitals:   09/07/16 1558  BP: (!) 135/95  Pulse: 69  Resp: 18  Temp: (!) 97.2 F (36.2 C)  TempSrc: Tympanic  Weight: 210 lb 14.4 oz (95.7 kg)   Physical Exam  Constitutional: She is oriented to person, place, and time and well-developed, well-nourished, and in no distress.  HENT:  Head: Normocephalic and atraumatic.  Eyes: Pupils are equal, round, and reactive to light. EOM are normal.  Neck: Normal range of motion.  Cardiovascular: Normal rate, regular rhythm and normal heart sounds.   Pulmonary/Chest: Effort normal and breath sounds normal.  Abdominal: Soft. Bowel sounds are normal.  Neurological: She is alert and oriented to person, place, and time.  Skin: Skin is warm and dry.     CMP Latest Ref Rng & Units 11/14/2015  Glucose 65 - 99 mg/dL 92  BUN 6 - 20 mg/dL 10  Creatinine 1.61 - 0.96 mg/dL 0.45  Sodium 409 - 811 mmol/L 139  Potassium 3.5 - 5.1 mmol/L 3.4(L)  Chloride 101 - 111 mmol/L 110  CO2 22 - 32 mmol/L 24  Calcium 8.9 - 10.3 mg/dL 9.1(Y)  Total Protein 6.5 - 8.1 g/dL 7.2  Total Bilirubin 0.3 - 1.2 mg/dL 0.6  Alkaline Phos 38 - 126 U/L 110  AST 15 - 41 U/L 29  ALT 14 - 54 U/L 41   CBC Latest Ref Rng & Units 09/07/2016  WBC 3.6 - 11.0 K/uL 5.2  Hemoglobin 12.0 - 16.0 g/dL 11.8(L)  Hematocrit 35.0 - 47.0 % 34.0(L)  Platelets 150 - 440 K/uL 208      Assessment and plan- Patient is a 31 y.o. female with iron deficiency anemia of pregnancy  Patient has now delivered her thrird child. No postpartum complications. Her H/H is improved today and iron studies are better. Will hold off on IV iron at this time. Repeat cbc and iron studies in 3 months and 6 months and she will see me in 6 months   Visit Diagnosis 1. Iron deficiency anemia secondary to inadequate dietary iron intake      Dr. Owens Shark, MD, MPH [facility]  at [facility] Pager- [phone] 09/09/2016 7:52 AM

## 2016-09-10 ENCOUNTER — Ambulatory Visit: Payer: Medicaid Other

## 2016-09-10 ENCOUNTER — Other Ambulatory Visit: Payer: Medicaid Other

## 2016-09-10 ENCOUNTER — Ambulatory Visit: Payer: Medicaid Other | Admitting: Oncology

## 2016-12-07 ENCOUNTER — Inpatient Hospital Stay: Payer: 59 | Attending: Oncology

## 2016-12-07 DIAGNOSIS — D509 Iron deficiency anemia, unspecified: Principal | ICD-10-CM

## 2016-12-07 DIAGNOSIS — O99019 Anemia complicating pregnancy, unspecified trimester: Secondary | ICD-10-CM | POA: Diagnosis not present

## 2016-12-07 DIAGNOSIS — D508 Other iron deficiency anemias: Secondary | ICD-10-CM | POA: Diagnosis not present

## 2016-12-07 DIAGNOSIS — Z9884 Bariatric surgery status: Secondary | ICD-10-CM

## 2016-12-07 DIAGNOSIS — Z79899 Other long term (current) drug therapy: Secondary | ICD-10-CM | POA: Insufficient documentation

## 2016-12-07 LAB — IRON AND TIBC
IRON: 74 ug/dL (ref 28–170)
Saturation Ratios: 21 % (ref 10.4–31.8)
TIBC: 347 ug/dL (ref 250–450)
UIBC: 273 ug/dL

## 2016-12-07 LAB — CBC WITH DIFFERENTIAL/PLATELET
Basophils Absolute: 0 10*3/uL (ref 0–0.1)
Basophils Relative: 1 %
EOS ABS: 0.2 10*3/uL (ref 0–0.7)
EOS PCT: 4 %
HEMATOCRIT: 35.5 % (ref 35.0–47.0)
Hemoglobin: 12 g/dL (ref 12.0–16.0)
LYMPHS ABS: 2 10*3/uL (ref 1.0–3.6)
LYMPHS PCT: 34 %
MCH: 29.4 pg (ref 26.0–34.0)
MCHC: 33.9 g/dL (ref 32.0–36.0)
MCV: 86.9 fL (ref 80.0–100.0)
MONO ABS: 0.4 10*3/uL (ref 0.2–0.9)
MONOS PCT: 7 %
Neutro Abs: 3.2 10*3/uL (ref 1.4–6.5)
Neutrophils Relative %: 54 %
Platelets: 187 10*3/uL (ref 150–440)
RBC: 4.09 MIL/uL (ref 3.80–5.20)
RDW: 13.4 % (ref 11.5–14.5)
WBC: 5.9 10*3/uL (ref 3.6–11.0)

## 2016-12-07 LAB — FERRITIN: Ferritin: 13 ng/mL (ref 11–307)

## 2016-12-13 ENCOUNTER — Telehealth: Payer: Self-pay | Admitting: *Deleted

## 2016-12-13 ENCOUNTER — Telehealth: Payer: Self-pay | Admitting: Oncology

## 2016-12-13 ENCOUNTER — Other Ambulatory Visit: Payer: Self-pay | Admitting: Oncology

## 2016-12-13 NOTE — Telephone Encounter (Signed)
I had called pt last week and she was agreable to venofer and I had to check with Dr. Smith Robertao and decide how many treatments she will need and pt asked if it was going to be the same iron that she got when she was pregnant. I spoke to Smith RobertRao and she states we can change her to [employer]eraheme if she is not breast feeding.  I have called pt to tell her it will be 5 treatments of venofer if she is breast feeding and if not she can change to feraheme and it will be 2 treatments one week apart.  Will wait for pt to call back.

## 2016-12-13 NOTE — Telephone Encounter (Signed)
NEW** Per Cordelia PenSherry (verbal) 1 hr FERAHEME per week x 2 @ 2:30pm on 10/29 and @ 3pm 11/05.  Per Cordelia PenSherry, will call patient with confirmation.

## 2016-12-13 NOTE — Telephone Encounter (Signed)
Pt called back and she is not breast feeding and she would like to switch to feraheme.  I have sent a note to sch. To make appts x 2 1 week apart and pt needs a 3 pm appt .

## 2016-12-17 ENCOUNTER — Ambulatory Visit: Payer: PRIVATE HEALTH INSURANCE

## 2016-12-17 ENCOUNTER — Telehealth: Payer: Self-pay | Admitting: *Deleted

## 2016-12-17 NOTE — Telephone Encounter (Signed)
[name] called to ask about pt getting feraheme next week instead of venofer. I told her that she was pregnant when she first came and now she is not and if she got venofer again it would be 5 visits and with  Dun[facility] DullFeraheme it is only 2 visits and that works better for her work schedule and cheaper fo her with charges for visits

## 2016-12-20 ENCOUNTER — Inpatient Hospital Stay: Payer: 59

## 2016-12-21 ENCOUNTER — Inpatient Hospital Stay: Payer: 59

## 2016-12-21 VITALS — BP 125/83 | HR 64

## 2016-12-21 DIAGNOSIS — O99011 Anemia complicating pregnancy, first trimester: Secondary | ICD-10-CM

## 2016-12-21 DIAGNOSIS — D508 Other iron deficiency anemias: Secondary | ICD-10-CM

## 2016-12-21 DIAGNOSIS — O99019 Anemia complicating pregnancy, unspecified trimester: Secondary | ICD-10-CM | POA: Diagnosis not present

## 2016-12-21 MED ORDER — SODIUM CHLORIDE 0.9 % IV SOLN
Freq: Once | INTRAVENOUS | Status: AC
Start: 2016-12-21 — End: 2016-12-21
  Administered 2016-12-21: 15:00:00 via INTRAVENOUS
  Filled 2016-12-21: qty 1000

## 2016-12-21 MED ORDER — FERUMOXYTOL INJECTION 510 MG/17 ML
510.0000 mg | Freq: Once | INTRAVENOUS | Status: AC
  Administered 2016-12-21: 510 mg via INTRAVENOUS
  Filled 2016-12-21: qty 17

## 2016-12-24 ENCOUNTER — Ambulatory Visit: Payer: PRIVATE HEALTH INSURANCE

## 2016-12-27 ENCOUNTER — Ambulatory Visit: Payer: PRIVATE HEALTH INSURANCE

## 2016-12-28 ENCOUNTER — Inpatient Hospital Stay: Payer: 59 | Attending: Oncology

## 2016-12-28 VITALS — BP 131/89 | HR 69 | Temp 97.0°F | Resp 18

## 2016-12-28 DIAGNOSIS — Z9884 Bariatric surgery status: Secondary | ICD-10-CM

## 2016-12-28 DIAGNOSIS — O99019 Anemia complicating pregnancy, unspecified trimester: Secondary | ICD-10-CM | POA: Insufficient documentation

## 2016-12-28 DIAGNOSIS — D508 Other iron deficiency anemias: Secondary | ICD-10-CM

## 2016-12-28 DIAGNOSIS — O99011 Anemia complicating pregnancy, first trimester: Secondary | ICD-10-CM

## 2016-12-28 DIAGNOSIS — Z79899 Other long term (current) drug therapy: Secondary | ICD-10-CM | POA: Diagnosis not present

## 2016-12-28 DIAGNOSIS — D509 Iron deficiency anemia, unspecified: Principal | ICD-10-CM

## 2016-12-28 MED ORDER — SODIUM CHLORIDE 0.9 % IV SOLN
Freq: Once | INTRAVENOUS | Status: AC
Start: 2016-12-28 — End: 2016-12-28
  Administered 2016-12-28: 16:00:00 via INTRAVENOUS
  Filled 2016-12-28: qty 1000

## 2016-12-28 MED ORDER — SODIUM CHLORIDE 0.9 % IV SOLN
510.0000 mg | Freq: Once | INTRAVENOUS | Status: AC
  Administered 2016-12-28: 510 mg via INTRAVENOUS
  Filled 2016-12-28: qty 17

## 2017-03-08 ENCOUNTER — Inpatient Hospital Stay: Payer: 59 | Admitting: Oncology

## 2017-03-08 ENCOUNTER — Inpatient Hospital Stay: Payer: 59

## 2017-03-11 ENCOUNTER — Inpatient Hospital Stay: Payer: 59 | Admitting: Oncology

## 2017-03-11 ENCOUNTER — Inpatient Hospital Stay: Payer: 59 | Attending: Oncology

## 2017-11-08 IMAGING — CT CT ABD-PELV W/ CM
2 of 4 series · 16 of 46 positions shown, 18 images · IV contrast (iopamidol)
Comparison: None.

CLINICAL DATA: Right lower quadrant pain, nausea for 3 days

EXAM:
CT ABDOMEN AND PELVIS WITH CONTRAST
TECHNIQUE: Multidetector CT imaging of the abdomen and pelvis was performed
using the standard protocol following bolus administration of
intravenous contrast.
CONTRAST:  100mL 955Y30-2LL IOPAMIDOL (955Y30-2LL) INJECTION 61%

[Series 2: axial st · axial · 0.87mm/px · z∈[-934,-494]mm · 13 of 97 slices shown, 15 images]
[im 5/97  soft-tissue]
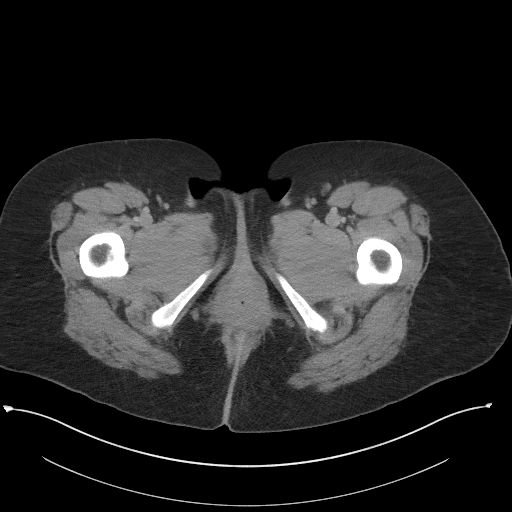
[im 5/97  bone]
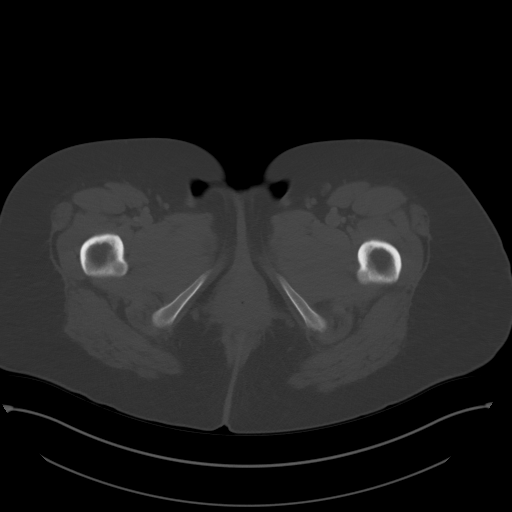
[im 13/97  soft-tissue]
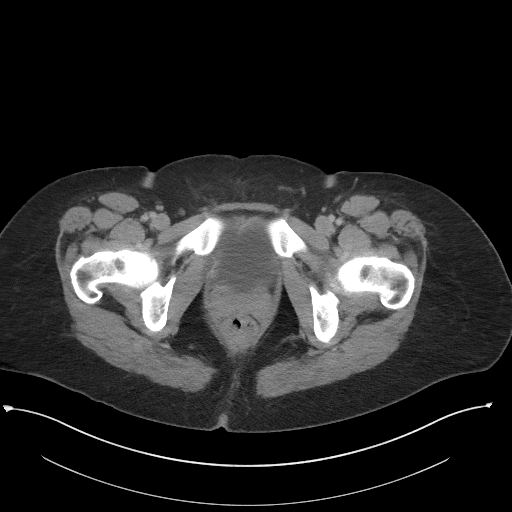
[im 21/97  soft-tissue]
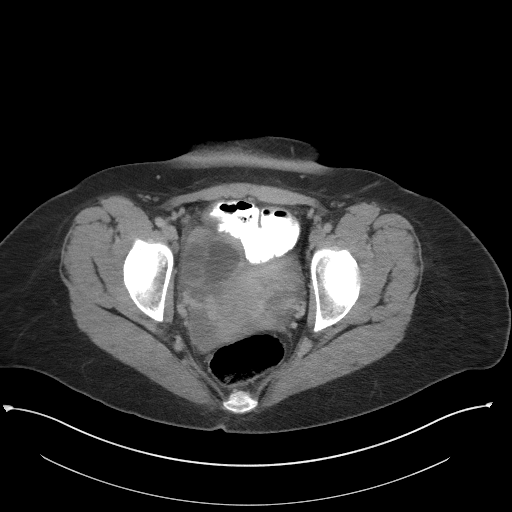
[im 29/97  soft-tissue]
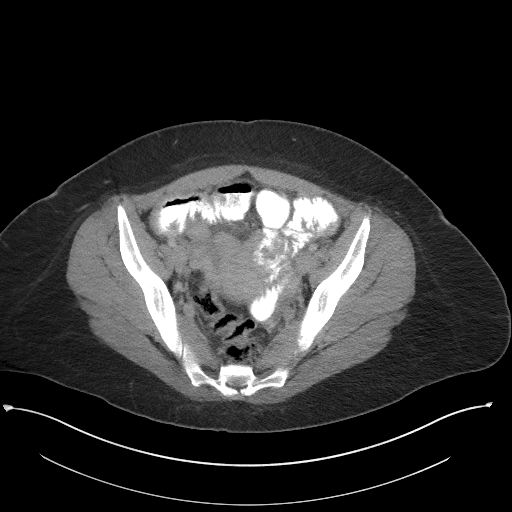
[im 33/97  soft-tissue]
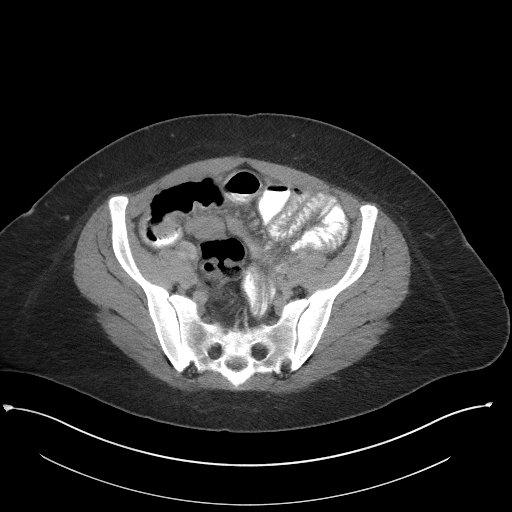
[im 41/97  soft-tissue]
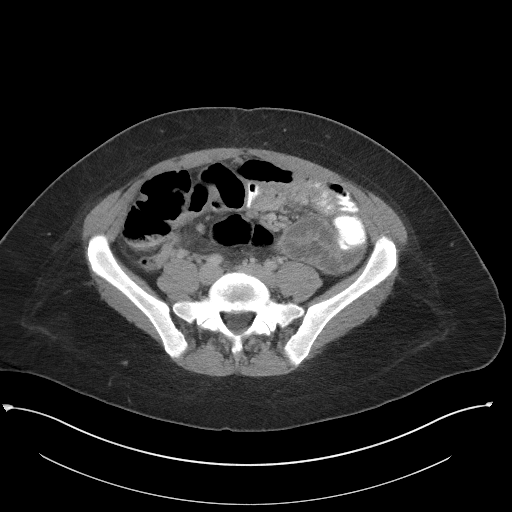
[im 49/97  soft-tissue]
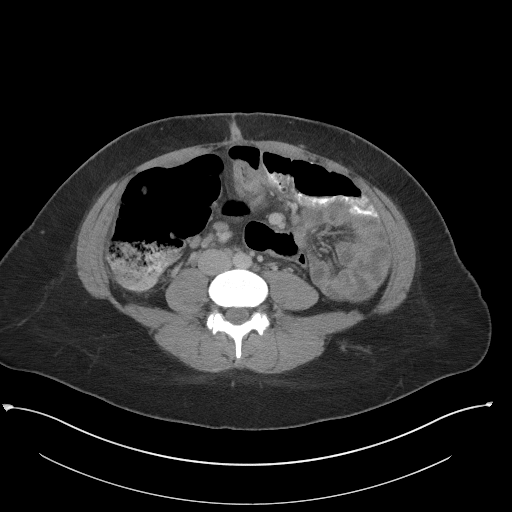
[im 57/97  soft-tissue]
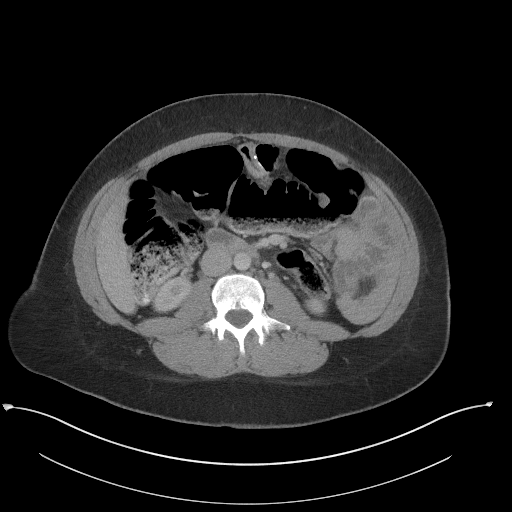
[im 65/97  soft-tissue]
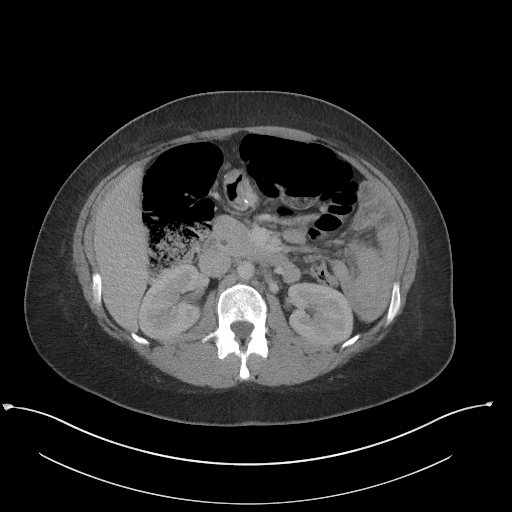
[im 65/97  bone]
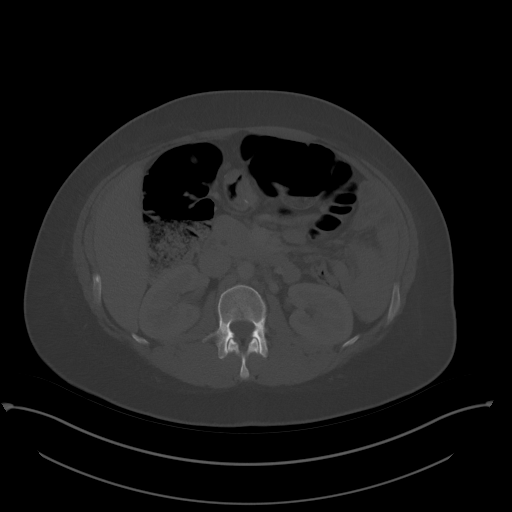
[im 69/97  soft-tissue]
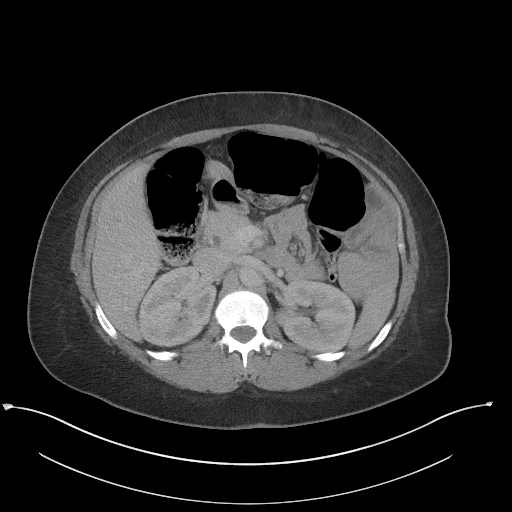
[im 77/97  soft-tissue]
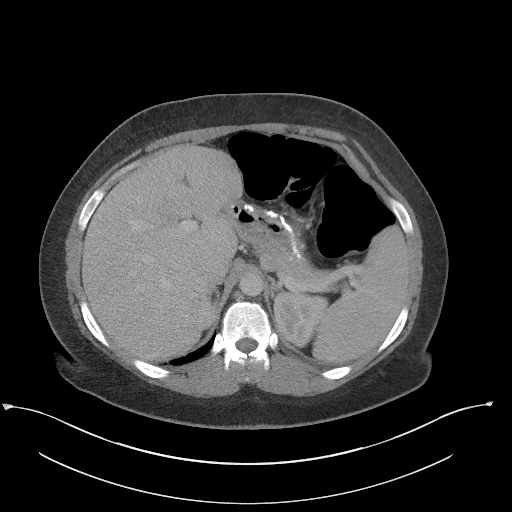
[im 85/97  soft-tissue]
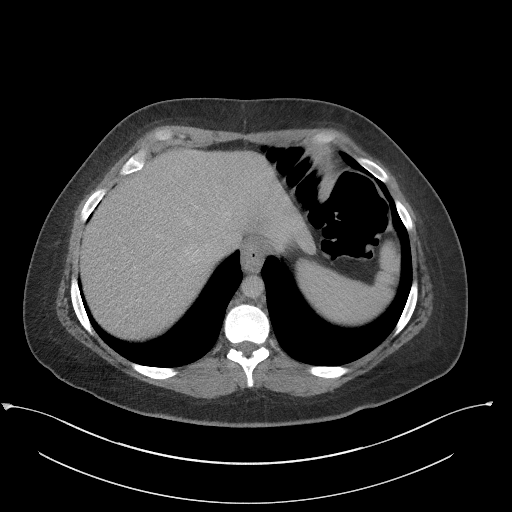
[im 93/97  soft-tissue]
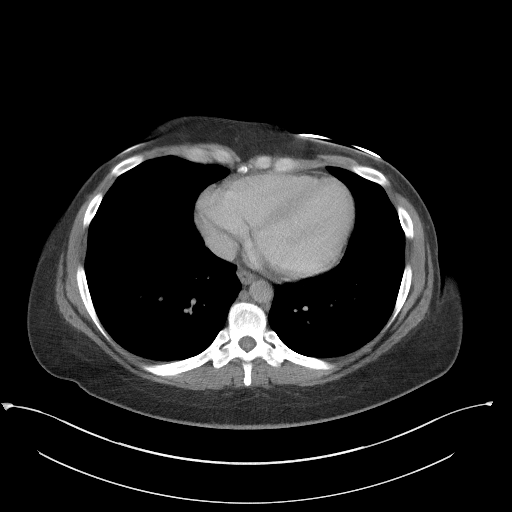

[Series 5: coronal st · coronal · 0.71mm/px · 3 of 90 slices shown]
[im 30/90  soft-tissue]
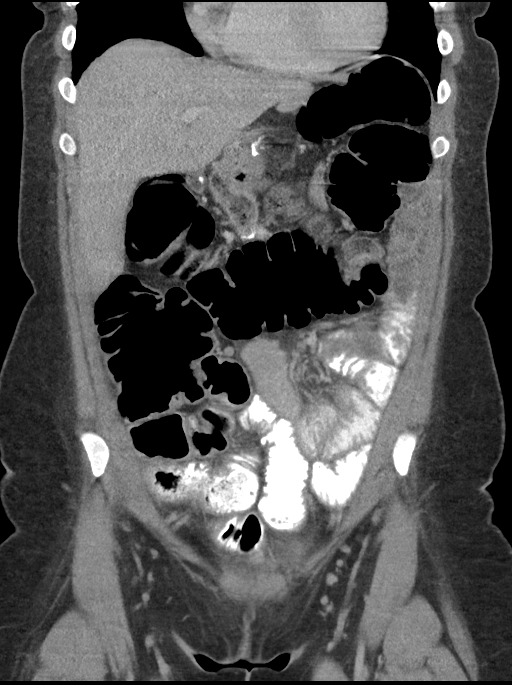
[im 40/90  soft-tissue]
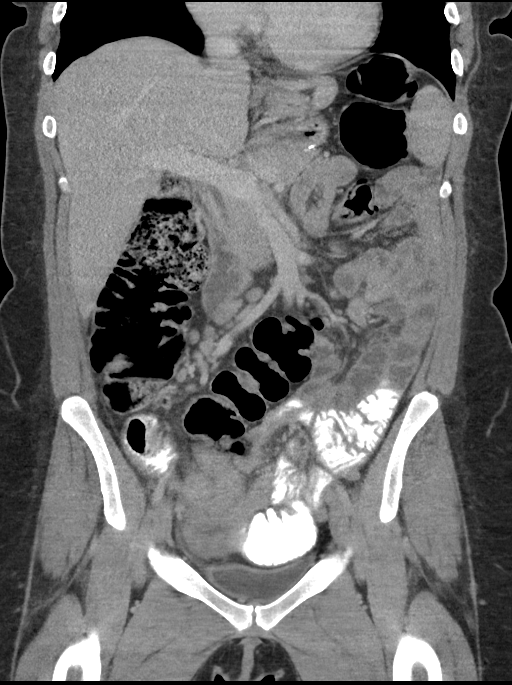
[im 50/90  soft-tissue]
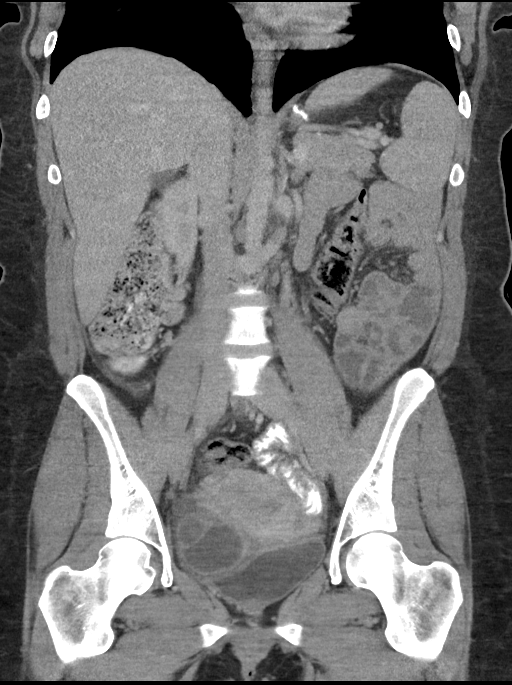

[16 of 46 positions shown; findings below may reference images not displayed]

FINDINGS: Lower chest: No acute abnormality.

Hepatobiliary: No focal liver abnormality is seen. Status post
cholecystectomy. No biliary dilatation.

Pancreas: Unremarkable. No pancreatic ductal dilatation or
surrounding inflammatory changes.

Spleen: Normal in size without focal abnormality.

Adrenals/Urinary Tract: Adrenal glands are unremarkable. Kidneys are
normal, without renal calculi, focal lesion, or hydronephrosis.
Bladder is unremarkable.

Stomach/Bowel: No bowel dilatation to suggest obstruction. Moderate
amount of stool in the ascending colon. No pneumatosis,
pneumoperitoneum or portal venous gas. Postsurgical changes from
prior gastric bypass. Mild wall thickening of the distal portion of
the appendix with periappendiceal inflammatory changes most
concerning for appendicitis.

Vascular/Lymphatic: Normal caliber abdominal aorta. No
lymphadenopathy.

Reproductive: Normal uterus. 4.8 cm hypodense, fluid attenuating
right adnexal mass most consistent with a cyst. Small amount of
pelvic free fluid.

Other: No fluid collection or hematoma.

Musculoskeletal: No acute osseous abnormality. No lytic or sclerotic
osseous lesion.
IMPRESSION: 1. Wall thickening of the distal aspect of the appendix with
periappendiceal inflammatory changes most consistent with acute
appendicitis.

## 2018-12-14 ENCOUNTER — Other Ambulatory Visit: Payer: Self-pay

## 2018-12-14 DIAGNOSIS — Z20822 Contact with and (suspected) exposure to covid-19: Secondary | ICD-10-CM

## 2018-12-16 LAB — NOVEL CORONAVIRUS, NAA: SARS-CoV-2, NAA: NOT DETECTED

## 2019-02-23 NOTE — L&D Delivery Note (Signed)
Delivery Note  Brooke Pearson is a [id number] at 61w4dwith an LMP of 05/03/2019, consistent with UKoreaat 637w4d  First Stage: Labor onset: 2200 Augmentation: oxytocin  Analgesia /Anesthesia intrapartum: Epidural  SROM at 1600 on 01/27/2020  Second Stage: Complete dilation at 0150 Onset of pushing at 0152 FHR second stage 130bpm with moderate variability, accels present   [name] to L&D with SROM and irregular contractions. Initial blood pressure was elevated but Preeclampsia labs were negative.  Subsequent blood pressures were in mild range, criteria met for gestational hypertension.  Labor was augmented with oxytocin.  She progressed well to C/C/+2 and pushed quickly over 2 conctractions.    Delivery of a viable baby girl on 01/28/2020 at 0156 by CNM Delivery of fetal head in ROA position with restitution to ROT. No nuchal cord;  Anterior then posterior shoulders delivered easily with gentle downward traction. Baby placed on mom's chest, and attended to by baby RN Cord double clamped after cessation of pulsation, cut by father of baby.   Cord blood sample collected: O pos   Third Stage: Oxytocin bolus started after delivery of infant for hemorrhage prophylaxis  Placenta delivered intact with 3 VC @ 0202 Placenta disposition: discarded  Uterine tone firm / bleeding moderate   1st degree perneal laceration identified  Anesthesia for repair: Lidocaine 1%  Repair with 2-0 Vicryl CT in usual fashion Est. Blood Loss (mL): 50196QI Complications: None  Mom to postpartum.  Baby to Couplet care / Skin to Skin.  Newborn: Information for the patient's newborn:  [name], Hungate0[[id number]]Live born female  Birth Weight:  8lbs 1oz APGAR: 8,458  Newborn Delivery   Birth date/time: 01/28/2020 01:56:00 Delivery type: Vaginal, Spontaneous     Feeding planned: Breast   ---------- Brooke ButtsCNM Certified Nurse Midwife [facility]

## 2019-03-02 ENCOUNTER — Ambulatory Visit: Payer: 59 | Attending: Internal Medicine

## 2019-03-02 DIAGNOSIS — Z20822 Contact with and (suspected) exposure to covid-19: Secondary | ICD-10-CM

## 2019-03-04 LAB — NOVEL CORONAVIRUS, NAA: SARS-CoV-2, NAA: NOT DETECTED

## 2019-04-21 ENCOUNTER — Ambulatory Visit: Payer: PRIVATE HEALTH INSURANCE | Attending: Internal Medicine

## 2019-04-21 DIAGNOSIS — Z23 Encounter for immunization: Secondary | ICD-10-CM | POA: Insufficient documentation

## 2019-04-21 NOTE — Progress Notes (Signed)
   Covid-19 Vaccination Clinic  Name:  TRISHA KEN    MRN: [id number] DOB: 1985/09/25  04/21/2019  Ms. Koci was observed post Covid-19 immunization for 15 minutes without incidence. She was provided with Vaccine Information Sheet and instruction to access the V-Safe system.   Ms. Haddox was instructed to call 911 with any severe reactions post vaccine: Marland Kitchen Difficulty breathing  . Swelling of your face and throat  . A fast heartbeat  . A bad rash all over your body  . Dizziness and weakness    Immunizations Administered    Name Date Dose VIS Date Route   Moderna COVID-19 Vaccine 04/21/2019 10:28 AM 0.5 mL 01/23/2019 Intramuscular   Manufacturer: Moderna   Lot: [id number]   NDC: 82060-156-15

## 2019-05-19 ENCOUNTER — Ambulatory Visit: Payer: PRIVATE HEALTH INSURANCE

## 2019-05-19 ENCOUNTER — Ambulatory Visit: Payer: PRIVATE HEALTH INSURANCE | Attending: Internal Medicine

## 2019-05-19 DIAGNOSIS — Z23 Encounter for immunization: Secondary | ICD-10-CM

## 2019-05-19 NOTE — Progress Notes (Signed)
   Covid-19 Vaccination Clinic  Name:  EDESSA JAKUBOWICZ    MRN: [id number] DOB: 1985-09-28  05/19/2019  Ms. Jester was observed post Covid-19 immunization for 15 minutes without incident. She was provided with Vaccine Information Sheet and instruction to access the V-Safe system.   Ms. Ishii was instructed to call 911 with any severe reactions post vaccine: Marland Kitchen Difficulty breathing  . Swelling of face and throat  . A fast heartbeat  . A bad rash all over body  . Dizziness and weakness   Immunizations Administered    Name Date Dose VIS Date Route   Moderna COVID-19 Vaccine 05/19/2019  2:43 PM 0.5 mL 01/23/2019 Intramuscular   Manufacturer: Gala Murdoch   Lot: [id number]   NDC: 76184-859-27

## 2019-05-22 ENCOUNTER — Ambulatory Visit: Payer: PRIVATE HEALTH INSURANCE

## 2019-07-18 DIAGNOSIS — Z6835 Body mass index (BMI) 35.0-35.9, adult: Secondary | ICD-10-CM | POA: Diagnosis not present

## 2019-07-18 DIAGNOSIS — O0991 Supervision of high risk pregnancy, unspecified, first trimester: Secondary | ICD-10-CM | POA: Diagnosis not present

## 2019-07-18 DIAGNOSIS — Z113 Encounter for screening for infections with a predominantly sexual mode of transmission: Secondary | ICD-10-CM | POA: Diagnosis not present

## 2019-07-18 DIAGNOSIS — Z1151 Encounter for screening for human papillomavirus (HPV): Secondary | ICD-10-CM | POA: Diagnosis not present

## 2019-07-18 DIAGNOSIS — Z8742 Personal history of other diseases of the female genital tract: Secondary | ICD-10-CM | POA: Diagnosis not present

## 2019-07-18 DIAGNOSIS — Z903 Acquired absence of stomach [part of]: Secondary | ICD-10-CM | POA: Diagnosis not present

## 2019-07-18 DIAGNOSIS — Z124 Encounter for screening for malignant neoplasm of cervix: Secondary | ICD-10-CM | POA: Diagnosis not present

## 2019-07-18 LAB — OB RESULTS CONSOLE VARICELLA ZOSTER ANTIBODY, IGG: Varicella: IMMUNE

## 2019-07-18 LAB — OB RESULTS CONSOLE RUBELLA ANTIBODY, IGM: Rubella: IMMUNE

## 2019-07-18 LAB — OB RESULTS CONSOLE HEPATITIS B SURFACE ANTIGEN: Hepatitis B Surface Ag: NEGATIVE

## 2019-07-18 LAB — OB RESULTS CONSOLE GC/CHLAMYDIA
Chlamydia: NEGATIVE
Gonorrhea: NEGATIVE

## 2019-07-18 LAB — OB RESULTS CONSOLE HIV ANTIBODY (ROUTINE TESTING): HIV: NONREACTIVE

## 2019-07-18 LAB — OB RESULTS CONSOLE RPR: RPR: NONREACTIVE

## 2019-08-01 DIAGNOSIS — Z6835 Body mass index (BMI) 35.0-35.9, adult: Secondary | ICD-10-CM | POA: Diagnosis not present

## 2019-08-01 DIAGNOSIS — O0991 Supervision of high risk pregnancy, unspecified, first trimester: Secondary | ICD-10-CM | POA: Diagnosis not present

## 2019-08-23 DIAGNOSIS — Z419 Encounter for procedure for purposes other than remedying health state, unspecified: Secondary | ICD-10-CM | POA: Diagnosis not present

## 2019-09-10 ENCOUNTER — Other Ambulatory Visit: Payer: Self-pay | Admitting: Oncology

## 2019-09-10 ENCOUNTER — Telehealth: Payer: Self-pay | Admitting: *Deleted

## 2019-09-10 NOTE — Telephone Encounter (Signed)
Got a message that pt was referred to Dr. Smith Robert for anemia. Please see ref. She last came to [place] in 2018. She would not be a new pt. I called pt and she states that she has anemia again from pregnancy- she is 19 weeks this Thursday. We gave her appt 7/22 at 1:45 per Dr. Smith Robert. Pt is acceptable for this.  Pt had labs doen at [facility] and rao reviewed it and said that she will need venofer and we checked with [facility] about insurance and it is [employer] and ID [id number] grp # [id number]. Phone # [phone]. I have spoke to pt and if it is approved  That she may can have iron on same day and she is ok with this. I have sent Merry Proud a message to check benefits

## 2019-09-13 ENCOUNTER — Inpatient Hospital Stay: Payer: 59

## 2019-09-13 ENCOUNTER — Encounter: Payer: Self-pay | Admitting: Oncology

## 2019-09-13 ENCOUNTER — Inpatient Hospital Stay: Payer: 59 | Attending: Oncology | Admitting: Oncology

## 2019-09-13 ENCOUNTER — Other Ambulatory Visit: Payer: Self-pay

## 2019-09-13 VITALS — BP 103/68 | HR 62 | Temp 97.7°F | Resp 17

## 2019-09-13 VITALS — BP 108/67 | HR 71 | Temp 98.5°F | Resp 16 | Ht 67.0 in | Wt 219.0 lb

## 2019-09-13 DIAGNOSIS — O99011 Anemia complicating pregnancy, first trimester: Secondary | ICD-10-CM

## 2019-09-13 DIAGNOSIS — O99012 Anemia complicating pregnancy, second trimester: Secondary | ICD-10-CM | POA: Diagnosis not present

## 2019-09-13 DIAGNOSIS — D509 Iron deficiency anemia, unspecified: Secondary | ICD-10-CM | POA: Diagnosis present

## 2019-09-13 DIAGNOSIS — D508 Other iron deficiency anemias: Secondary | ICD-10-CM

## 2019-09-13 MED ORDER — IRON SUCROSE 20 MG/ML IV SOLN
200.0000 mg | Freq: Once | INTRAVENOUS | Status: AC
  Administered 2019-09-13: 200 mg via INTRAVENOUS
  Filled 2019-09-13: qty 10

## 2019-09-13 MED ORDER — SODIUM CHLORIDE 0.9 % IV SOLN: 200.0000 mg | INTRAVENOUS | Status: DC

## 2019-09-13 MED ORDER — SODIUM CHLORIDE 0.9 % IV SOLN
Freq: Once | INTRAVENOUS | Status: AC
  Filled 2019-09-13: qty 250

## 2019-09-13 NOTE — Progress Notes (Signed)
Pt feels like her iron levels are going down, she also had bariatric surgery so some could be from that and because of [redacted] weeks pregnant. She is tired

## 2019-09-13 NOTE — Progress Notes (Signed)
Hematology/Oncology Consult note [facility]  Telephone:([phone] [phone]  Patient Care Team: Kandyce Rud, MD as PCP - General (Family Medicine)   Name of the patient: Brooke Pearson  [id number]  [birth year]   Date of visit: 09/13/19  Diagnosis- iron deficiency anemia affecting second trimester of pregnancy  Chief complaint/ Reason for visit-reestablish follow-up for iron deficiency anemia  Heme/Onc history: Patient is a 34 year old female with history of iron deficiency anemia in pregnancy.  She required IV iron in her third pregnancy and she is currently pregnant with her fourth child and due on 02/07/2020.  Most recent CBC From 08/29/2019 showed white count of 6.6, H&H of 9.7/29.8 with an MCV of 89 and a platelet count of 191.  Ferritin levels were low at 7.  B12 levels were normal at 1029.    Interval history-patient is currently [redacted] weeks pregnant and reports her pregnancy is coming along well.  She reports mild fatigue but denies other complaints  ECOG PS- 0 Pain scale- 0   Review of systems- Review of Systems  Constitutional: Positive for malaise/fatigue. Negative for chills, fever and weight loss.  HENT: Negative for congestion, ear discharge and nosebleeds.   Eyes: Negative for blurred vision.  Respiratory: Negative for cough, hemoptysis, sputum production, shortness of breath and wheezing.   Cardiovascular: Negative for chest pain, palpitations, orthopnea and claudication.  Gastrointestinal: Negative for abdominal pain, blood in stool, constipation, diarrhea, heartburn, melena, nausea and vomiting.  Genitourinary: Negative for dysuria, flank pain, frequency, hematuria and urgency.  Musculoskeletal: Negative for back pain, joint pain and myalgias.  Skin: Negative for rash.  Neurological: Negative for dizziness, tingling, focal weakness, seizures, weakness and headaches.  Endo/Heme/Allergies: Does not bruise/bleed easily.    Psychiatric/Behavioral: Negative for depression and suicidal ideas. The patient does not have insomnia.        Allergies  Allergen Reactions  . Butorphanol Tartrate Nausea And Vomiting  . Sulfa Antibiotics Rash     Past Medical History:  Diagnosis Date  . Anemia   . Asthma   . Sleep apnea      Past Surgical History:  Procedure Laterality Date  . ABDOMINAL SURGERY  aug 2015   gastric bypass  . CHOLECYSTECTOMY    . GASTRIC BYPASS  aug 2015  . HERNIA REPAIR  aug 2015  . LAPAROSCOPIC APPENDECTOMY N/A 11/02/2015   Procedure: APPENDECTOMY LAPAROSCOPIC;  Surgeon: Tiney Rouge III, MD;  Location: [facility] ORS;  Service: General;  Laterality: N/A;  . LAPAROSCOPIC UNILATERAL SALPINGECTOMY Right 11/02/2015   Procedure: LAPAROSCOPIC RIGHT PARTIAL SALPINGECTOMY;  Surgeon: Tiney Rouge III, MD;  Location: [facility] ORS;  Service: General;  Laterality: Right;  Performed by Dr. Bonney Aid     Social History   Socioeconomic History  . Marital status: Single    Spouse name: Not on file  . Number of children: Not on file  . Years of education: Not on file  . Highest education level: Not on file  Occupational History  . Not on file  Tobacco Use  . Smoking status: Never Smoker  . Smokeless tobacco: Never Used  Vaping Use  . Vaping Use: Never used  Substance and Sexual Activity  . Alcohol use: No    Comment: social  . Drug use: No  . Sexual activity: Yes  Other Topics Concern  . Not on file  Social History Narrative  . Not on file   Social Determinants of Health   Financial Resource Strain:   . Difficulty  of Paying Living Expenses:   Food Insecurity:   . Worried About Programme researcher, broadcasting/film/video in the Last Year:   . Barista in the Last Year:   Transportation Needs:   . Freight forwarder (Medical):   Marland Kitchen Lack of Transportation (Non-Medical):   Physical Activity:   . Days of Exercise per Week:   . Minutes of Exercise per Session:   Stress:   . Feeling of Stress :   Social  Connections:   . Frequency of Communication with Friends and Family:   . Frequency of Social Gatherings with Friends and Family:   . Attends Religious Services:   . Active Member of Clubs or Organizations:   . Attends Banker Meetings:   Marland Kitchen Marital Status:   Intimate Partner Violence:   . Fear of Current or Ex-Partner:   . Emotionally Abused:   Marland Kitchen Physically Abused:   . Sexually Abused:     History reviewed. No pertinent family history.   Current Outpatient Medications:  .  albuterol (PROVENTIL HFA;VENTOLIN HFA) 108 (90 Base) MCG/ACT inhaler, Inhale 2 puffs into the lungs every 4 (four) hours as needed for wheezing or shortness of breath., Disp: , Rfl:  .  Calcium Carbonate-Vit D-Min (CALCIUM 600+D3 PLUS MINERALS) 600-800 MG-UNIT CHEW, Chew 12 tablets by mouth daily., Disp: , Rfl:  .  Multiple Vitamin (MULTI-VITAMINS) TABS, Take 1 tablet by mouth daily., Disp: , Rfl:  .  Prenatal Vit-Fe Fumarate-FA (PRENATAL MULTIVITAMIN) TABS tablet, Take 1 tablet by mouth daily at 12 noon., Disp: , Rfl:  .  PULMICORT FLEXHALER 90 MCG/ACT inhaler, Inhale 90 mcg into the lungs 2 (two) times daily. (Patient not taking: Reported on 09/13/2019), Disp: , Rfl: 12 No current facility-administered medications for this visit.  Facility-Administered Medications Ordered in Other Visits:  .  0.9 %  sodium chloride infusion, , Intravenous, Once, Creig Hines, MD .  iron sucrose (VENOFER) injection 200 mg, 200 mg, Intravenous, Once, Creig Hines, MD  Physical exam:  Vitals:   09/13/19 1427  BP: 108/67  Pulse: 71  Resp: 16  Temp: 98.5 F (36.9 C)  TempSrc: Oral  Weight: (!) 219 lb (99.3 kg)  Height: 5\' 7"  (1.702 m)   Physical Exam Constitutional:      General: She is not in acute distress. Cardiovascular:     Rate and Rhythm: Normal rate and regular rhythm.     Heart sounds: Normal heart sounds.  Pulmonary:     Effort: Pulmonary effort is normal.     Breath sounds: Normal breath  sounds.  Abdominal:     General: Bowel sounds are normal.     Palpations: Abdomen is soft.  Skin:    General: Skin is warm and dry.  Neurological:     Mental Status: She is alert and oriented to person, place, and time.      CMP Latest Ref Rng & Units 11/14/2015  Glucose 65 - 99 mg/dL 92  BUN 6 - 20 mg/dL 10  Creatinine 11/16/2015 - 9.32 mg/dL 3.55  Sodium 7.32 - 202 mmol/L 139  Potassium 3.5 - 5.1 mmol/L 3.4(L)  Chloride 101 - 111 mmol/L 110  CO2 22 - 32 mmol/L 24  Calcium 8.9 - 10.3 mg/dL 542)  Total Protein 6.5 - 8.1 g/dL 7.2  Total Bilirubin 0.3 - 1.2 mg/dL 0.6  Alkaline Phos 38 - 126 U/L 110  AST 15 - 41 U/L 29  ALT 14 - 54 U/L 41  CBC Latest Ref Rng & Units 12/07/2016  WBC 3.6 - 11.0 K/uL 5.9  Hemoglobin 12.0 - 16.0 g/dL 93.7  Hematocrit 35 - 47 % 35.5  Platelets 150 - 440 K/uL 187      Assessment and plan- Patient is a 34 y.o. female with iron deficiency anemia affecting second trimester of pregnancy  This is patient's fourth pregnancy and patient had iron deficiency anemia with her third pregnancy as well requiring IV iron.  Recent labs are indicative of iron deficiency with a hemoglobin of 9.7 and a ferritin level of 7.  I therefore recommend 5 doses of Venofer 200 mg IV over the next 2 to 3 weeks.  Discussed risks and benefits of IV iron including all but not limited to possible risk of infusion reaction.  Patient understands and agrees to proceed as planned  I will see her back in 2 months for a video visit with CBC ferritin and iron studies B12 and folate   Visit Diagnosis 1. Anemia affecting pregnancy in second trimester   2. Iron deficiency anemia, unspecified iron deficiency anemia type      Dr. Owens Shark, MD, MPH [facility] at [facility] [phone] 09/13/2019 2:50 PM

## 2019-09-13 NOTE — Progress Notes (Signed)
Pt tolerated infusion well. No s/s of distress or reaction noted. Pt and VS stable at discharge.  

## 2019-09-17 ENCOUNTER — Inpatient Hospital Stay: Payer: 59

## 2019-09-17 ENCOUNTER — Other Ambulatory Visit: Payer: Self-pay

## 2019-09-17 VITALS — BP 108/67 | HR 76 | Temp 97.2°F | Resp 18

## 2019-09-17 DIAGNOSIS — D508 Other iron deficiency anemias: Secondary | ICD-10-CM

## 2019-09-17 DIAGNOSIS — O99012 Anemia complicating pregnancy, second trimester: Secondary | ICD-10-CM | POA: Diagnosis not present

## 2019-09-17 DIAGNOSIS — O99011 Anemia complicating pregnancy, first trimester: Secondary | ICD-10-CM

## 2019-09-17 MED ORDER — IRON SUCROSE 20 MG/ML IV SOLN
200.0000 mg | Freq: Once | INTRAVENOUS | Status: AC
  Administered 2019-09-17: 200 mg via INTRAVENOUS
  Filled 2019-09-17: qty 10

## 2019-09-17 MED ORDER — SODIUM CHLORIDE 0.9 % IV SOLN: 200.0000 mg | INTRAVENOUS | Status: DC

## 2019-09-17 MED ORDER — SODIUM CHLORIDE 0.9 % IV SOLN
Freq: Once | INTRAVENOUS | Status: AC
  Filled 2019-09-17: qty 250

## 2019-09-21 ENCOUNTER — Other Ambulatory Visit: Payer: Self-pay

## 2019-09-21 ENCOUNTER — Inpatient Hospital Stay: Payer: 59

## 2019-09-21 VITALS — BP 119/64 | HR 81 | Temp 97.0°F

## 2019-09-21 DIAGNOSIS — O99011 Anemia complicating pregnancy, first trimester: Secondary | ICD-10-CM

## 2019-09-21 DIAGNOSIS — D508 Other iron deficiency anemias: Secondary | ICD-10-CM

## 2019-09-21 DIAGNOSIS — O99012 Anemia complicating pregnancy, second trimester: Secondary | ICD-10-CM | POA: Diagnosis not present

## 2019-09-21 MED ORDER — IRON SUCROSE 20 MG/ML IV SOLN
200.0000 mg | Freq: Once | INTRAVENOUS | Status: AC
  Administered 2019-09-21: 200 mg via INTRAVENOUS
  Filled 2019-09-21: qty 10

## 2019-09-21 MED ORDER — SODIUM CHLORIDE 0.9 % IV SOLN: 200.0000 mg | INTRAVENOUS | Status: DC

## 2019-09-21 MED ORDER — SODIUM CHLORIDE 0.9 % IV SOLN
Freq: Once | INTRAVENOUS | Status: AC
  Filled 2019-09-21: qty 250

## 2019-09-23 DIAGNOSIS — Z419 Encounter for procedure for purposes other than remedying health state, unspecified: Secondary | ICD-10-CM | POA: Diagnosis not present

## 2019-09-24 ENCOUNTER — Other Ambulatory Visit: Payer: Self-pay

## 2019-09-24 ENCOUNTER — Inpatient Hospital Stay: Payer: 59 | Attending: Oncology

## 2019-09-24 VITALS — BP 107/50 | HR 66 | Temp 97.3°F | Resp 18

## 2019-09-24 DIAGNOSIS — O99011 Anemia complicating pregnancy, first trimester: Secondary | ICD-10-CM

## 2019-09-24 DIAGNOSIS — D509 Iron deficiency anemia, unspecified: Secondary | ICD-10-CM | POA: Insufficient documentation

## 2019-09-24 DIAGNOSIS — O99012 Anemia complicating pregnancy, second trimester: Secondary | ICD-10-CM | POA: Diagnosis present

## 2019-09-24 DIAGNOSIS — D508 Other iron deficiency anemias: Secondary | ICD-10-CM

## 2019-09-24 MED ORDER — SODIUM CHLORIDE 0.9 % IV SOLN
Freq: Once | INTRAVENOUS | Status: AC
  Filled 2019-09-24: qty 250

## 2019-09-24 MED ORDER — IRON SUCROSE 20 MG/ML IV SOLN
200.0000 mg | Freq: Once | INTRAVENOUS | Status: AC
  Administered 2019-09-24: 200 mg via INTRAVENOUS
  Filled 2019-09-24: qty 10

## 2019-09-24 MED ORDER — SODIUM CHLORIDE 0.9 % IV SOLN: 200.0000 mg | INTRAVENOUS | Status: DC

## 2019-09-27 ENCOUNTER — Inpatient Hospital Stay: Payer: 59

## 2019-10-05 ENCOUNTER — Inpatient Hospital Stay: Payer: 59

## 2019-10-05 VITALS — BP 102/55 | HR 62 | Temp 96.0°F | Resp 18

## 2019-10-05 DIAGNOSIS — D508 Other iron deficiency anemias: Secondary | ICD-10-CM

## 2019-10-05 DIAGNOSIS — O99011 Anemia complicating pregnancy, first trimester: Secondary | ICD-10-CM

## 2019-10-05 DIAGNOSIS — O99012 Anemia complicating pregnancy, second trimester: Secondary | ICD-10-CM | POA: Diagnosis not present

## 2019-10-05 MED ORDER — IRON SUCROSE 20 MG/ML IV SOLN
200.0000 mg | Freq: Once | INTRAVENOUS | Status: AC
  Administered 2019-10-05: 200 mg via INTRAVENOUS
  Filled 2019-10-05: qty 10

## 2019-10-05 MED ORDER — SODIUM CHLORIDE 0.9 % IV SOLN: 200.0000 mg | INTRAVENOUS | Status: DC

## 2019-10-05 MED ORDER — SODIUM CHLORIDE 0.9 % IV SOLN
Freq: Once | INTRAVENOUS | Status: AC
  Filled 2019-10-05: qty 250

## 2019-10-05 NOTE — Progress Notes (Signed)
Pt tolerated infusion well. No s/s of distress or reaction noted. Pt and VS stable at discharge.  

## 2019-10-24 ENCOUNTER — Other Ambulatory Visit: Payer: 59

## 2019-10-24 DIAGNOSIS — Z419 Encounter for procedure for purposes other than remedying health state, unspecified: Secondary | ICD-10-CM | POA: Diagnosis not present

## 2019-11-14 ENCOUNTER — Other Ambulatory Visit: Payer: Self-pay

## 2019-11-14 ENCOUNTER — Inpatient Hospital Stay: Payer: 59 | Attending: Oncology

## 2019-11-14 DIAGNOSIS — D509 Iron deficiency anemia, unspecified: Secondary | ICD-10-CM | POA: Diagnosis present

## 2019-11-14 DIAGNOSIS — O99012 Anemia complicating pregnancy, second trimester: Secondary | ICD-10-CM

## 2019-11-14 DIAGNOSIS — O99013 Anemia complicating pregnancy, third trimester: Secondary | ICD-10-CM | POA: Insufficient documentation

## 2019-11-14 LAB — CBC WITH DIFFERENTIAL/PLATELET
Abs Immature Granulocytes: 0.05 10*3/uL (ref 0.00–0.07)
Basophils Absolute: 0 10*3/uL (ref 0.0–0.1)
Basophils Relative: 0 %
Eosinophils Absolute: 0.1 10*3/uL (ref 0.0–0.5)
Eosinophils Relative: 1 %
HCT: 29.9 % — ABNORMAL LOW (ref 36.0–46.0)
Hemoglobin: 10.4 g/dL — ABNORMAL LOW (ref 12.0–15.0)
Immature Granulocytes: 1 %
Lymphocytes Relative: 20 %
Lymphs Abs: 1.5 10*3/uL (ref 0.7–4.0)
MCH: 31 pg (ref 26.0–34.0)
MCHC: 34.8 g/dL (ref 30.0–36.0)
MCV: 89.3 fL (ref 80.0–100.0)
Monocytes Absolute: 0.3 10*3/uL (ref 0.1–1.0)
Monocytes Relative: 4 %
Neutro Abs: 5.6 10*3/uL (ref 1.7–7.7)
Neutrophils Relative %: 74 %
Platelets: 217 10*3/uL (ref 150–400)
RBC: 3.35 MIL/uL — ABNORMAL LOW (ref 3.87–5.11)
RDW: 13.5 % (ref 11.5–15.5)
WBC: 7.5 10*3/uL (ref 4.0–10.5)
nRBC: 0 % (ref 0.0–0.2)

## 2019-11-14 LAB — TSH: TSH: 1.123 u[IU]/mL (ref 0.350–4.500)

## 2019-11-14 LAB — IRON AND TIBC
Iron: 73 ug/dL (ref 28–170)
Saturation Ratios: 17 % (ref 10.4–31.8)
TIBC: 421 ug/dL (ref 250–450)
UIBC: 348 ug/dL

## 2019-11-14 LAB — VITAMIN B12: Vitamin B-12: 597 pg/mL (ref 180–914)

## 2019-11-14 LAB — FERRITIN: Ferritin: 32 ng/mL (ref 11–307)

## 2019-11-14 LAB — FOLATE: Folate: 13.6 ng/mL (ref >5.9)

## 2019-11-16 ENCOUNTER — Encounter: Payer: Self-pay | Admitting: Oncology

## 2019-11-16 ENCOUNTER — Inpatient Hospital Stay (HOSPITAL_BASED_OUTPATIENT_CLINIC_OR_DEPARTMENT_OTHER): Payer: 59 | Admitting: Oncology

## 2019-11-16 DIAGNOSIS — D508 Other iron deficiency anemias: Secondary | ICD-10-CM | POA: Diagnosis not present

## 2019-11-19 NOTE — Progress Notes (Signed)
I connected with Brooke Pearson on 11/19/19 at  2:45 PM EDT by video enabled telemedicine visit and verified that I am speaking with the correct person using two identifiers.   I discussed the limitations, risks, security and privacy concerns of performing an evaluation and management service by telemedicine and the availability of in-person appointments. I also discussed with the patient that there may be a patient responsible charge related to this service. The patient expressed understanding and agreed to proceed.  Other persons participating in the visit and their role in the encounter:  none  Patient's location:  home Provider's location:  work  Stage manager Complaint: Routine follow-up of iron deficiency anemia  History of present illness: Patient is a 34 year old female with history of iron deficiency anemia in pregnancy.  She required IV iron in her third pregnancy and she is currently pregnant with her fourth child and due on 02/07/2020.  Most recent CBC From 08/29/2019 showed white count of 6.6, H&H of 9.7/29.8 with an MCV of 89 and a platelet count of 191.  Ferritin levels were low at 7.  B12 levels were normal at 1029.     Interval history: Doing well overall and denies any complaints at this time other than mild fatigue.  Pregnancy is coming along well and she is currently in her third trimester at 28 weeks.   Review of Systems  Constitutional: Negative for chills, fever, malaise/fatigue and weight loss.  HENT: Negative for congestion, ear discharge and nosebleeds.   Eyes: Negative for blurred vision.  Respiratory: Negative for cough, hemoptysis, sputum production, shortness of breath and wheezing.   Cardiovascular: Negative for chest pain, palpitations, orthopnea and claudication.  Gastrointestinal: Negative for abdominal pain, blood in stool, constipation, diarrhea, heartburn, melena, nausea and vomiting.  Genitourinary: Negative for dysuria, flank pain, frequency, hematuria and  urgency.  Musculoskeletal: Negative for back pain, joint pain and myalgias.  Skin: Negative for rash.  Neurological: Negative for dizziness, tingling, focal weakness, seizures, weakness and headaches.  Endo/Heme/Allergies: Does not bruise/bleed easily.  Psychiatric/Behavioral: Negative for depression and suicidal ideas. The patient does not have insomnia.     Allergies  Allergen Reactions  . Butorphanol Tartrate Nausea And Vomiting  . Penicillin V Rash  . Sulfa Antibiotics Rash    Past Medical History:  Diagnosis Date  . Anemia   . Asthma   . Sleep apnea     Past Surgical History:  Procedure Laterality Date  . ABDOMINAL SURGERY  aug 2015   gastric bypass  . CHOLECYSTECTOMY    . GASTRIC BYPASS  aug 2015  . HERNIA REPAIR  aug 2015  . LAPAROSCOPIC APPENDECTOMY N/A 11/02/2015   Procedure: APPENDECTOMY LAPAROSCOPIC;  Surgeon: Tiney Rouge III, MD;  Location: [facility] ORS;  Service: General;  Laterality: N/A;  . LAPAROSCOPIC UNILATERAL SALPINGECTOMY Right 11/02/2015   Procedure: LAPAROSCOPIC RIGHT PARTIAL SALPINGECTOMY;  Surgeon: Tiney Rouge III, MD;  Location: [facility] ORS;  Service: General;  Laterality: Right;  Performed by Dr. Bonney Aid     Social History   Socioeconomic History  . Marital status: Single    Spouse name: Not on file  . Number of children: Not on file  . Years of education: Not on file  . Highest education level: Not on file  Occupational History  . Not on file  Tobacco Use  . Smoking status: Never Smoker  . Smokeless tobacco: Never Used  Vaping Use  . Vaping Use: Never used  Substance and Sexual Activity  . Alcohol use: No  Comment: social  . Drug use: No  . Sexual activity: Yes  Other Topics Concern  . Not on file  Social History Narrative  . Not on file   Social Determinants of Health   Financial Resource Strain:   . Difficulty of Paying Living Expenses: Not on file  Food Insecurity:   . Worried About Programme researcher, broadcasting/film/video in the Last Year: Not on  file  . Ran Out of Food in the Last Year: Not on file  Transportation Needs:   . Lack of Transportation (Medical): Not on file  . Lack of Transportation (Non-Medical): Not on file  Physical Activity:   . Days of Exercise per Week: Not on file  . Minutes of Exercise per Session: Not on file  Stress:   . Feeling of Stress : Not on file  Social Connections:   . Frequency of Communication with Friends and Family: Not on file  . Frequency of Social Gatherings with Friends and Family: Not on file  . Attends Religious Services: Not on file  . Active Member of Clubs or Organizations: Not on file  . Attends Banker Meetings: Not on file  . Marital Status: Not on file  Intimate Partner Violence:   . Fear of Current or Ex-Partner: Not on file  . Emotionally Abused: Not on file  . Physically Abused: Not on file  . Sexually Abused: Not on file    History reviewed. No pertinent family history.   Current Outpatient Medications:  .  albuterol (PROVENTIL HFA;VENTOLIN HFA) 108 (90 Base) MCG/ACT inhaler, Inhale 2 puffs into the lungs every 4 (four) hours as needed for wheezing or shortness of breath., Disp: , Rfl:  .  Calcium Carbonate-Vit D-Min (CALCIUM 600+D3 PLUS MINERALS) 600-800 MG-UNIT CHEW, Chew 12 tablets by mouth daily., Disp: , Rfl:  .  Multiple Vitamin (MULTI-VITAMINS) TABS, Take 1 tablet by mouth daily., Disp: , Rfl:  .  Prenatal Vit-Fe Fumarate-FA (PRENATAL MULTIVITAMIN) TABS tablet, Take 1 tablet by mouth daily at 12 noon., Disp: , Rfl:  .  PULMICORT FLEXHALER 90 MCG/ACT inhaler, Inhale 90 mcg into the lungs 2 (two) times daily. (Patient not taking: Reported on 09/13/2019), Disp: , Rfl: 12  No results found.  No images are attached to the encounter.   CMP Latest Ref Rng & Units 11/14/2015  Glucose 65 - 99 mg/dL 92  BUN 6 - 20 mg/dL 10  Creatinine 5.02 - 7.74 mg/dL 1.28  Sodium 786 - 767 mmol/L 139  Potassium 3.5 - 5.1 mmol/L 3.4(L)  Chloride 101 - 111 mmol/L 110   CO2 22 - 32 mmol/L 24  Calcium 8.9 - 10.3 mg/dL 2.0(N)  Total Protein 6.5 - 8.1 g/dL 7.2  Total Bilirubin 0.3 - 1.2 mg/dL 0.6  Alkaline Phos 38 - 126 U/L 110  AST 15 - 41 U/L 29  ALT 14 - 54 U/L 41   CBC Latest Ref Rng & Units 11/14/2019  WBC 4.0 - 10.5 K/uL 7.5  Hemoglobin 12.0 - 15.0 g/dL 10.4(L)  Hematocrit 36 - 46 % 29.9(L)  Platelets 150 - 400 K/uL 217     Observation/objective: Appears in no acute distress over video visit today.  Breathing is nonlabored  Assessment and plan: Patient is a 34 year old female with history of iron deficiency anemia in third trimester of pregnancy.    Patient received 5 doses of Venofer between July and August 2021.  She still remains moderately anemic at 10.4 although her iron studies have normalized.  B12  and folate are also normal.  I am inclined to monitor this conservatively and I will repeat a CBC ferritin and iron studies in 4 weeks followed by a video visit.  There is evidence of persistent iron deficiency at that time I will consider giving her IV iron before her delivery.   Follow-up instructions: As above  I discussed the assessment and treatment plan with the patient. The patient was provided an opportunity to ask questions and all were answered. The patient agreed with the plan and demonstrated an understanding of the instructions.   The patient was advised to call back or seek an in-person evaluation if the symptoms worsen or if the condition fails to improve as anticipated.   Visit Diagnosis: 1. Iron deficiency anemia secondary to inadequate dietary iron intake     Dr. Owens Shark, MD, MPH [facility] at [facility] Tel- [phone] 11/19/2019 4:11 PM

## 2019-11-23 DIAGNOSIS — Z419 Encounter for procedure for purposes other than remedying health state, unspecified: Secondary | ICD-10-CM | POA: Diagnosis not present

## 2019-12-13 ENCOUNTER — Other Ambulatory Visit: Payer: Self-pay

## 2019-12-13 ENCOUNTER — Inpatient Hospital Stay: Payer: 59 | Attending: Oncology

## 2019-12-13 DIAGNOSIS — D508 Other iron deficiency anemias: Secondary | ICD-10-CM

## 2019-12-13 DIAGNOSIS — O99013 Anemia complicating pregnancy, third trimester: Secondary | ICD-10-CM | POA: Insufficient documentation

## 2019-12-13 DIAGNOSIS — D509 Iron deficiency anemia, unspecified: Secondary | ICD-10-CM | POA: Insufficient documentation

## 2019-12-13 LAB — CBC WITH DIFFERENTIAL/PLATELET
Abs Immature Granulocytes: 0.07 10*3/uL (ref 0.00–0.07)
Basophils Absolute: 0 10*3/uL (ref 0.0–0.1)
Basophils Relative: 0 %
Eosinophils Absolute: 0.1 10*3/uL (ref 0.0–0.5)
Eosinophils Relative: 1 %
HCT: 29.3 % — ABNORMAL LOW (ref 36.0–46.0)
Hemoglobin: 10.3 g/dL — ABNORMAL LOW (ref 12.0–15.0)
Immature Granulocytes: 1 %
Lymphocytes Relative: 19 %
Lymphs Abs: 1.7 10*3/uL (ref 0.7–4.0)
MCH: 30.9 pg (ref 26.0–34.0)
MCHC: 35.2 g/dL (ref 30.0–36.0)
MCV: 88 fL (ref 80.0–100.0)
Monocytes Absolute: 0.4 10*3/uL (ref 0.1–1.0)
Monocytes Relative: 4 %
Neutro Abs: 6.7 10*3/uL (ref 1.7–7.7)
Neutrophils Relative %: 75 %
Platelets: 198 10*3/uL (ref 150–400)
RBC: 3.33 MIL/uL — ABNORMAL LOW (ref 3.87–5.11)
RDW: 13.2 % (ref 11.5–15.5)
WBC: 9 10*3/uL (ref 4.0–10.5)
nRBC: 0 % (ref 0.0–0.2)

## 2019-12-13 LAB — IRON AND TIBC
Iron: 70 ug/dL (ref 28–170)
Saturation Ratios: 15 % (ref 10.4–31.8)
TIBC: 472 ug/dL — ABNORMAL HIGH (ref 250–450)
UIBC: 402 ug/dL

## 2019-12-13 LAB — FERRITIN: Ferritin: 16 ng/mL (ref 11–307)

## 2019-12-14 ENCOUNTER — Inpatient Hospital Stay (HOSPITAL_BASED_OUTPATIENT_CLINIC_OR_DEPARTMENT_OTHER): Payer: 59 | Admitting: Oncology

## 2019-12-14 DIAGNOSIS — O99013 Anemia complicating pregnancy, third trimester: Secondary | ICD-10-CM

## 2019-12-14 DIAGNOSIS — D508 Other iron deficiency anemias: Secondary | ICD-10-CM | POA: Diagnosis not present

## 2019-12-16 NOTE — Progress Notes (Signed)
I connected with Brooke Pearson on 12/16/19 at  2:15 PM EDT by video enabled telemedicine visit and verified that I am speaking with the correct person using two identifiers.  There were problems with video visit and was switched to phone call   I discussed the limitations, risks, security and privacy concerns of performing an evaluation and management service by telemedicine and the availability of in-person appointments. I also discussed with the patient that there may be a patient responsible charge related to this service. The patient expressed understanding and agreed to proceed.  Other persons participating in the visit and their role in the encounter:  none  Patient's location:  home Provider's location:  work  Stage manager Complaint: Routine follow-up of anemia in pregnancy  History of present illness: Patient is a 34 year old female with history of iron deficiency anemia in pregnancy. She required IV iron in her third pregnancy and she is currently pregnant with her fourth child and due on 02/07/2020. Most recent CBC From 08/29/2019 showed white count of 6.6, H&H of 9.7/29.8 with an MCV of 89 and a platelet count of 191. Ferritin levels were low at 7. B12 levels were normal at 1029.   Interval history: Patient reports doing well in her pregnancy is coming along well so far and she is currently about 32 weeks   Review of Systems  Constitutional: Positive for malaise/fatigue. Negative for chills, fever and weight loss.  HENT: Negative for congestion, ear discharge and nosebleeds.   Eyes: Negative for blurred vision.  Respiratory: Negative for cough, hemoptysis, sputum production, shortness of breath and wheezing.   Cardiovascular: Negative for chest pain, palpitations, orthopnea and claudication.  Gastrointestinal: Negative for abdominal pain, blood in stool, constipation, diarrhea, heartburn, melena, nausea and vomiting.  Genitourinary: Negative for dysuria, flank pain, frequency,  hematuria and urgency.  Musculoskeletal: Negative for back pain, joint pain and myalgias.  Skin: Negative for rash.  Neurological: Negative for dizziness, tingling, focal weakness, seizures, weakness and headaches.  Endo/Heme/Allergies: Does not bruise/bleed easily.  Psychiatric/Behavioral: Negative for depression and suicidal ideas. The patient does not have insomnia.     Allergies  Allergen Reactions  . Butorphanol Tartrate Nausea And Vomiting  . Penicillin V Rash  . Sulfa Antibiotics Rash    Past Medical History:  Diagnosis Date  . Anemia   . Asthma   . Sleep apnea     Past Surgical History:  Procedure Laterality Date  . ABDOMINAL SURGERY  aug 2015   gastric bypass  . CHOLECYSTECTOMY    . GASTRIC BYPASS  aug 2015  . HERNIA REPAIR  aug 2015  . LAPAROSCOPIC APPENDECTOMY N/A 11/02/2015   Procedure: APPENDECTOMY LAPAROSCOPIC;  Surgeon: Tiney Rouge III, MD;  Location: [facility] ORS;  Service: General;  Laterality: N/A;  . LAPAROSCOPIC UNILATERAL SALPINGECTOMY Right 11/02/2015   Procedure: LAPAROSCOPIC RIGHT PARTIAL SALPINGECTOMY;  Surgeon: Tiney Rouge III, MD;  Location: [facility] ORS;  Service: General;  Laterality: Right;  Performed by Dr. Bonney Aid     Social History   Socioeconomic History  . Marital status: Single    Spouse name: Not on file  . Number of children: Not on file  . Years of education: Not on file  . Highest education level: Not on file  Occupational History  . Not on file  Tobacco Use  . Smoking status: Never Smoker  . Smokeless tobacco: Never Used  Vaping Use  . Vaping Use: Never used  Substance and Sexual Activity  . Alcohol use: No  Comment: social  . Drug use: No  . Sexual activity: Yes  Other Topics Concern  . Not on file  Social History Narrative  . Not on file   Social Determinants of Health   Financial Resource Strain:   . Difficulty of Paying Living Expenses: Not on file  Food Insecurity:   . Worried About Programme researcher, broadcasting/film/video in the Last  Year: Not on file  . Ran Out of Food in the Last Year: Not on file  Transportation Needs:   . Lack of Transportation (Medical): Not on file  . Lack of Transportation (Non-Medical): Not on file  Physical Activity:   . Days of Exercise per Week: Not on file  . Minutes of Exercise per Session: Not on file  Stress:   . Feeling of Stress : Not on file  Social Connections:   . Frequency of Communication with Friends and Family: Not on file  . Frequency of Social Gatherings with Friends and Family: Not on file  . Attends Religious Services: Not on file  . Active Member of Clubs or Organizations: Not on file  . Attends Banker Meetings: Not on file  . Marital Status: Not on file  Intimate Partner Violence:   . Fear of Current or Ex-Partner: Not on file  . Emotionally Abused: Not on file  . Physically Abused: Not on file  . Sexually Abused: Not on file    No family history on file.   Current Outpatient Medications:  .  albuterol (PROVENTIL HFA;VENTOLIN HFA) 108 (90 Base) MCG/ACT inhaler, Inhale 2 puffs into the lungs every 4 (four) hours as needed for wheezing or shortness of breath., Disp: , Rfl:  .  Calcium Carbonate-Vit D-Min (CALCIUM 600+D3 PLUS MINERALS) 600-800 MG-UNIT CHEW, Chew 12 tablets by mouth daily., Disp: , Rfl:  .  Multiple Vitamin (MULTI-VITAMINS) TABS, Take 1 tablet by mouth daily., Disp: , Rfl:  .  Prenatal Vit-Fe Fumarate-FA (PRENATAL MULTIVITAMIN) TABS tablet, Take 1 tablet by mouth daily at 12 noon., Disp: , Rfl:  .  PULMICORT FLEXHALER 90 MCG/ACT inhaler, Inhale 90 mcg into the lungs 2 (two) times daily. (Patient not taking: Reported on 09/13/2019), Disp: , Rfl: 12  No results found.  No images are attached to the encounter.   CMP Latest Ref Rng & Units 11/14/2015  Glucose 65 - 99 mg/dL 92  BUN 6 - 20 mg/dL 10  Creatinine 8.25 - 0.03 mg/dL 7.04  Sodium 888 - 916 mmol/L 139  Potassium 3.5 - 5.1 mmol/L 3.4(L)  Chloride 101 - 111 mmol/L 110  CO2 22 -  32 mmol/L 24  Calcium 8.9 - 10.3 mg/dL 9.4(H)  Total Protein 6.5 - 8.1 g/dL 7.2  Total Bilirubin 0.3 - 1.2 mg/dL 0.6  Alkaline Phos 38 - 126 U/L 110  AST 15 - 41 U/L 29  ALT 14 - 54 U/L 41   CBC Latest Ref Rng & Units 12/13/2019  WBC 4.0 - 10.5 K/uL 9.0  Hemoglobin 12.0 - 15.0 g/dL 10.3(L)  Hematocrit 36 - 46 % 29.3(L)  Platelets 150 - 400 K/uL 198     Assessment and plan: Patient is a 34 year old female with iron deficiency anemia and this is a routine follow-up visit  Despite giving 5 doses of Venofer patient's hemoglobin has not improved significantly and remains around 10.3.  Ferritin levels are still low at 16 with iron studies showing elevated TIBC of 472.  She will therefore proceed with 3 more doses of Venofer at 300  mg at this time so she is optimized prior to delivery.  Repeat CBC ferritin and iron studies in the first week of December and I will see her mid February after delivery with labs  Follow-up instructions: as above  I discussed the assessment and treatment plan with the patient. The patient was provided an opportunity to ask questions and all were answered. The patient agreed with the plan and demonstrated an understanding of the instructions.   The patient was advised to call back or seek an in-person evaluation if the symptoms worsen or if the condition fails to improve as anticipated.   Visit Diagnosis: 1. Iron deficiency anemia secondary to inadequate dietary iron intake   2. Anemia affecting pregnancy in third trimester     Dr. Owens Shark, MD, MPH [facility] at [facility] Tel- [phone] 12/16/2019 9:39 PM

## 2019-12-20 ENCOUNTER — Other Ambulatory Visit: Payer: Self-pay

## 2019-12-20 ENCOUNTER — Inpatient Hospital Stay: Payer: 59

## 2019-12-20 VITALS — BP 117/71 | HR 68 | Temp 97.7°F | Resp 20

## 2019-12-20 DIAGNOSIS — O99011 Anemia complicating pregnancy, first trimester: Secondary | ICD-10-CM

## 2019-12-20 DIAGNOSIS — D508 Other iron deficiency anemias: Secondary | ICD-10-CM

## 2019-12-20 DIAGNOSIS — O99013 Anemia complicating pregnancy, third trimester: Secondary | ICD-10-CM | POA: Diagnosis not present

## 2019-12-20 MED ORDER — SODIUM CHLORIDE 0.9 % IV SOLN
Freq: Once | INTRAVENOUS | Status: AC
  Filled 2019-12-20: qty 250

## 2019-12-20 MED ORDER — SODIUM CHLORIDE 0.9 % IV SOLN
300.0000 mg | INTRAVENOUS | Status: DC
  Administered 2019-12-20: 300 mg via INTRAVENOUS
  Filled 2019-12-20: qty 15

## 2019-12-20 NOTE — Progress Notes (Signed)
Patient unable to finish entire bag of venofer due to time and needing to be at another appt at 4pm. Infusion stopped at 1535. Patient tolerated infusion well. Stable at discharge.

## 2019-12-24 DIAGNOSIS — Z419 Encounter for procedure for purposes other than remedying health state, unspecified: Secondary | ICD-10-CM | POA: Diagnosis not present

## 2019-12-25 ENCOUNTER — Inpatient Hospital Stay: Payer: 59 | Attending: Oncology

## 2019-12-25 ENCOUNTER — Other Ambulatory Visit: Payer: Self-pay

## 2019-12-25 VITALS — BP 122/80 | HR 66 | Temp 97.7°F | Resp 16

## 2019-12-25 DIAGNOSIS — D509 Iron deficiency anemia, unspecified: Secondary | ICD-10-CM | POA: Insufficient documentation

## 2019-12-25 DIAGNOSIS — O99011 Anemia complicating pregnancy, first trimester: Secondary | ICD-10-CM

## 2019-12-25 DIAGNOSIS — D508 Other iron deficiency anemias: Secondary | ICD-10-CM

## 2019-12-25 MED ORDER — SODIUM CHLORIDE 0.9 % IV SOLN
300.0000 mg | INTRAVENOUS | Status: DC
  Administered 2019-12-25: 300 mg via INTRAVENOUS
  Filled 2019-12-25: qty 15

## 2019-12-25 MED ORDER — SODIUM CHLORIDE 0.9 % IV SOLN
Freq: Once | INTRAVENOUS | Status: AC
  Filled 2019-12-25: qty 250

## 2019-12-28 ENCOUNTER — Inpatient Hospital Stay: Payer: 59

## 2020-01-08 ENCOUNTER — Inpatient Hospital Stay: Payer: 59

## 2020-01-08 VITALS — BP 128/80 | HR 65

## 2020-01-08 DIAGNOSIS — D509 Iron deficiency anemia, unspecified: Secondary | ICD-10-CM | POA: Diagnosis not present

## 2020-01-08 DIAGNOSIS — O99011 Anemia complicating pregnancy, first trimester: Secondary | ICD-10-CM

## 2020-01-08 DIAGNOSIS — D508 Other iron deficiency anemias: Secondary | ICD-10-CM

## 2020-01-08 MED ORDER — SODIUM CHLORIDE 0.9 % IV SOLN
300.0000 mg | INTRAVENOUS | Status: DC
  Administered 2020-01-08: 300 mg via INTRAVENOUS
  Filled 2020-01-08: qty 15

## 2020-01-08 MED ORDER — SODIUM CHLORIDE 0.9 % IV SOLN
Freq: Once | INTRAVENOUS | Status: AC
  Filled 2020-01-08: qty 250

## 2020-01-10 LAB — OB RESULTS CONSOLE GBS: GBS: NEGATIVE

## 2020-01-10 LAB — OB RESULTS CONSOLE HIV ANTIBODY (ROUTINE TESTING): HIV: NONREACTIVE

## 2020-01-10 LAB — OB RESULTS CONSOLE RPR: RPR: NONREACTIVE

## 2020-01-23 DIAGNOSIS — Z419 Encounter for procedure for purposes other than remedying health state, unspecified: Secondary | ICD-10-CM | POA: Diagnosis not present

## 2020-01-27 ENCOUNTER — Encounter: Payer: Self-pay | Admitting: *Deleted

## 2020-01-27 ENCOUNTER — Inpatient Hospital Stay: Payer: 59 | Admitting: Anesthesiology

## 2020-01-27 ENCOUNTER — Inpatient Hospital Stay
Admission: EM | Admit: 2020-01-27 | Discharge: 2020-01-29 | DRG: 807 | Disposition: A | Discharge status: Home / Self Care | Payer: 59

## 2020-01-27 ENCOUNTER — Other Ambulatory Visit: Payer: Self-pay

## 2020-01-27 DIAGNOSIS — O134 Gestational [pregnancy-induced] hypertension without significant proteinuria, complicating childbirth: Secondary | ICD-10-CM | POA: Diagnosis present

## 2020-01-27 DIAGNOSIS — D509 Iron deficiency anemia, unspecified: Secondary | ICD-10-CM | POA: Diagnosis present

## 2020-01-27 DIAGNOSIS — O9902 Anemia complicating childbirth: Secondary | ICD-10-CM | POA: Diagnosis present

## 2020-01-27 DIAGNOSIS — O26843 Uterine size-date discrepancy, third trimester: Secondary | ICD-10-CM | POA: Insufficient documentation

## 2020-01-27 DIAGNOSIS — J45909 Unspecified asthma, uncomplicated: Secondary | ICD-10-CM | POA: Diagnosis present

## 2020-01-27 DIAGNOSIS — O4290 Premature rupture of membranes, unspecified as to length of time between rupture and onset of labor, unspecified weeks of gestation: Secondary | ICD-10-CM | POA: Diagnosis present

## 2020-01-27 DIAGNOSIS — O26893 Other specified pregnancy related conditions, third trimester: Secondary | ICD-10-CM | POA: Diagnosis present

## 2020-01-27 DIAGNOSIS — Z20822 Contact with and (suspected) exposure to covid-19: Secondary | ICD-10-CM | POA: Diagnosis present

## 2020-01-27 DIAGNOSIS — O99213 Obesity complicating pregnancy, third trimester: Secondary | ICD-10-CM | POA: Diagnosis present

## 2020-01-27 DIAGNOSIS — O0993 Supervision of high risk pregnancy, unspecified, third trimester: Secondary | ICD-10-CM

## 2020-01-27 DIAGNOSIS — O133 Gestational [pregnancy-induced] hypertension without significant proteinuria, third trimester: Secondary | ICD-10-CM | POA: Diagnosis present

## 2020-01-27 DIAGNOSIS — O99844 Bariatric surgery status complicating childbirth: Secondary | ICD-10-CM | POA: Diagnosis present

## 2020-01-27 DIAGNOSIS — O9952 Diseases of the respiratory system complicating childbirth: Secondary | ICD-10-CM | POA: Diagnosis present

## 2020-01-27 DIAGNOSIS — Z3A38 38 weeks gestation of pregnancy: Secondary | ICD-10-CM

## 2020-01-27 LAB — CBC
HCT: 34.1 % — ABNORMAL LOW (ref 36.0–46.0)
Hemoglobin: 11.8 g/dL — ABNORMAL LOW (ref 12.0–15.0)
MCH: 31.4 pg (ref 26.0–34.0)
MCHC: 34.6 g/dL (ref 30.0–36.0)
MCV: 90.7 fL (ref 80.0–100.0)
Platelets: 192 10*3/uL (ref 150–400)
RBC: 3.76 MIL/uL — ABNORMAL LOW (ref 3.87–5.11)
RDW: 13.3 % (ref 11.5–15.5)
WBC: 9.3 10*3/uL (ref 4.0–10.5)
nRBC: 0 % (ref 0.0–0.2)

## 2020-01-27 LAB — TYPE AND SCREEN
ABO/RH(D): O POS
Antibody Screen: NEGATIVE

## 2020-01-27 LAB — COMPREHENSIVE METABOLIC PANEL
ALT: 19 U/L (ref 0–44)
AST: 20 U/L (ref 15–41)
Albumin: 3.4 g/dL — ABNORMAL LOW (ref 3.5–5.0)
Alkaline Phosphatase: 100 U/L (ref 38–126)
Anion gap: 9 (ref 5–15)
BUN: 7 mg/dL (ref 6–20)
CO2: 20 mmol/L — ABNORMAL LOW (ref 22–32)
Calcium: 8.5 mg/dL — ABNORMAL LOW (ref 8.9–10.3)
Chloride: 109 mmol/L (ref 98–111)
Creatinine, Ser: 0.43 mg/dL — ABNORMAL LOW (ref 0.44–1.00)
GFR, Estimated: >60 mL/min (ref >60)
Glucose, Bld: 81 mg/dL (ref 70–99)
Potassium: 3.1 mmol/L — ABNORMAL LOW (ref 3.5–5.1)
Sodium: 138 mmol/L (ref 135–145)
Total Bilirubin: 0.9 mg/dL (ref 0.3–1.2)
Total Protein: 6.6 g/dL (ref 6.5–8.1)

## 2020-01-27 LAB — PROTEIN / CREATININE RATIO, URINE
Creatinine, Urine: 80 mg/dL
Protein Creatinine Ratio: 0.23 mg/mg{Cre} — ABNORMAL HIGH (ref 0.00–0.15)
Total Protein, Urine: 18 mg/dL

## 2020-01-27 LAB — RESP PANEL BY RT-PCR (FLU A&B, COVID) ARPGX2
Influenza A by PCR: NEGATIVE
Influenza B by PCR: NEGATIVE
SARS Coronavirus 2 by RT PCR: NEGATIVE

## 2020-01-27 MED ORDER — LIDOCAINE HCL (PF) 1 % IJ SOLN
INTRAMUSCULAR | Status: DC | PRN
  Administered 2020-01-27: 2 mL
  Administered 2020-01-27: 3 mL
  Administered 2020-01-27: 2 mL
  Administered 2020-01-27: 3 mL

## 2020-01-27 MED ORDER — LIDOCAINE HCL (PF) 1 % IJ SOLN
30.0000 mL | INTRAMUSCULAR | Status: AC | PRN
  Administered 2020-01-28: 30 mL via SUBCUTANEOUS
  Filled 2020-01-27: qty 30

## 2020-01-27 MED ORDER — FENTANYL CITRATE (PF) 100 MCG/2ML IJ SOLN: 50.0000 ug | INTRAMUSCULAR | Status: DC | PRN

## 2020-01-27 MED ORDER — OXYTOCIN 10 UNIT/ML IJ SOLN
INTRAMUSCULAR | Status: AC
  Filled 2020-01-27: qty 2

## 2020-01-27 MED ORDER — OXYTOCIN-SODIUM CHLORIDE 30-0.9 UT/500ML-% IV SOLN
2.5000 [IU]/h | INTRAVENOUS | Status: DC
  Administered 2020-01-28 (×2): 2.5 [IU]/h via INTRAVENOUS
  Filled 2020-01-27: qty 1000
  Filled 2020-01-27: qty 500

## 2020-01-27 MED ORDER — EPHEDRINE 5 MG/ML INJ
10.0000 mg | INTRAVENOUS | Status: DC | PRN
  Filled 2020-01-27: qty 2

## 2020-01-27 MED ORDER — FENTANYL 2.5 MCG/ML W/ROPIVACAINE 0.15% IN NS 100 ML EPIDURAL (ARMC)
EPIDURAL | Status: DC | PRN
  Administered 2020-01-27: 12 mL/h via EPIDURAL

## 2020-01-27 MED ORDER — LIDOCAINE-EPINEPHRINE (PF) 1.5 %-1:200000 IJ SOLN
INTRAMUSCULAR | Status: DC | PRN
  Administered 2020-01-27: 3 mL via EPIDURAL

## 2020-01-27 MED ORDER — LACTATED RINGERS IV SOLN: INTRAVENOUS | Status: DC

## 2020-01-27 MED ORDER — OXYTOCIN-SODIUM CHLORIDE 30-0.9 UT/500ML-% IV SOLN
1.0000 m[IU]/min | INTRAVENOUS | Status: DC
  Administered 2020-01-27: 2 m[IU]/min via INTRAVENOUS

## 2020-01-27 MED ORDER — FENTANYL 2.5 MCG/ML W/ROPIVACAINE 0.15% IN NS 100 ML EPIDURAL (ARMC)
EPIDURAL | Status: AC
  Filled 2020-01-27: qty 100

## 2020-01-27 MED ORDER — LACTATED RINGERS IV SOLN: 500.0000 mL | Freq: Once | INTRAVENOUS | Status: DC

## 2020-01-27 MED ORDER — PHENYLEPHRINE 40 MCG/ML (10ML) SYRINGE FOR IV PUSH (FOR BLOOD PRESSURE SUPPORT)
80.0000 ug | PREFILLED_SYRINGE | INTRAVENOUS | Status: DC | PRN
  Filled 2020-01-27: qty 10

## 2020-01-27 MED ORDER — SODIUM CHLORIDE 0.9 % IV SOLN
INTRAVENOUS | Status: DC | PRN
  Administered 2020-01-27: 8 mL via EPIDURAL

## 2020-01-27 MED ORDER — LACTATED RINGERS IV SOLN
500.0000 mL | INTRAVENOUS | Status: DC | PRN
  Administered 2020-01-27: 500 mL via INTRAVENOUS

## 2020-01-27 MED ORDER — ACETAMINOPHEN 500 MG PO TABS: 1000.0000 mg | ORAL_TABLET | Freq: Four times a day (QID) | ORAL | Status: DC | PRN

## 2020-01-27 MED ORDER — AMMONIA AROMATIC IN INHA
RESPIRATORY_TRACT | Status: AC
  Filled 2020-01-27: qty 10

## 2020-01-27 MED ORDER — ONDANSETRON HCL 4 MG/2ML IJ SOLN
4.0000 mg | Freq: Four times a day (QID) | INTRAMUSCULAR | Status: DC | PRN
  Administered 2020-01-28: 4 mg via INTRAVENOUS
  Filled 2020-01-27: qty 2

## 2020-01-27 MED ORDER — OXYTOCIN BOLUS FROM INFUSION
333.0000 mL | Freq: Once | INTRAVENOUS | Status: AC
  Administered 2020-01-28: 333 mL via INTRAVENOUS

## 2020-01-27 MED ORDER — TERBUTALINE SULFATE 1 MG/ML IJ SOLN: 0.2500 mg | Freq: Once | INTRAMUSCULAR | Status: DC | PRN

## 2020-01-27 MED ORDER — DIPHENHYDRAMINE HCL 50 MG/ML IJ SOLN: 12.5000 mg | INTRAMUSCULAR | Status: DC | PRN

## 2020-01-27 MED ORDER — CALCIUM CARBONATE ANTACID 500 MG PO CHEW: 400.0000 mg | CHEWABLE_TABLET | Freq: Three times a day (TID) | ORAL | Status: DC | PRN

## 2020-01-27 MED ORDER — MISOPROSTOL 200 MCG PO TABS
ORAL_TABLET | ORAL | Status: AC
  Filled 2020-01-27: qty 4

## 2020-01-27 MED ORDER — FENTANYL 2.5 MCG/ML W/ROPIVACAINE 0.15% IN NS 100 ML EPIDURAL (ARMC): 12.0000 mL/h | EPIDURAL | Status: DC

## 2020-01-27 NOTE — H&P (Signed)
OB History & Physical   History of Present Illness:  Chief Complaint:   HPI:  KANIYA TRUEHEART is a 34 y.o. G54P3003 female at 70w3ddated by LMP of 05/03/2019, c/w [place] 669w4d She presents to L&D for SROM.  Reports active fetal movement  Contractions: irregular cramping  LOF/SROM: SROM at 1600 Vaginal bleeding: denies   Pregnancy Issues: 1. History of bariatric surgery  2. Iron-deficiency anemia in pregnancy  3. History of anxiety  Patient Active Problem List   Diagnosis Date Noted  . Delayed delivery after SROM (spontaneous rupture of membranes) 01/27/2020  . Uterine size date discrepancy, third trimester 01/27/2020  . Supervision of high risk pregnancy in third trimester 07/01/2016  . 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 Iron deficiency anemia secondary to inadequate dietary iron intake 12/24/2015  . Maternal iron deficiency anemia affecting pregnancy in third trimester, antepartum 12/18/2015  . Elevated transaminase level 12/11/2015  . Obesity affecting pregnancy in third trimester 12/09/2015  . 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 Asthma 11/29/2014  . Morbid obesity ([facility]08/05/2013     Maternal Medical History:   Past Medical History:  Diagnosis Date  . Anemia   . Asthma   . Torsion of fallopian tube 11/02/2015    Past Surgical History:  Procedure Laterality Date  . ABDOMINAL SURGERY  aug 2015   gastric bypass  . CHOLECYSTECTOMY    . GASTRIC BYPASS  aug 2015  . HERNIA REPAIR  aug 2015  . LAPAROSCOPIC APPENDECTOMY N/A 11/02/2015   Procedure: APPENDECTOMY LAPAROSCOPIC;  Surgeon: RaDia CrawfordII, MD;  Location: [facility] ORS;  Service: General;  Laterality: N/A;  . LAPAROSCOPIC UNILATERAL SALPINGECTOMY Right 11/02/2015   Procedure: LAPAROSCOPIC RIGHT PARTIAL SALPINGECTOMY;  Surgeon: RaDia CrawfordII, MD;  Location: [facility] ORS;  Service: General;  Laterality: Right;  Performed by Dr. StGeorgianne Fick   Allergies  Allergen Reactions  . Butorphanol Tartrate Nausea And  Vomiting  . Penicillin V Rash  . Sulfa Antibiotics Rash    Prior to Admission medications   Medication Sig Start Date End Date Taking? Authorizing Provider  albuterol (PROVENTIL HFA;VENTOLIN HFA) 108 (90 Base) MCG/ACT inhaler Inhale 2 puffs into the lungs every 4 (four) hours as needed for wheezing or shortness of breath.   Yes [provider]  Calcium Carbonate-Vit D-Min (CALCIUM 600+D3 PLUS MINERALS) 600-800 MG-UNIT CHEW Chew 12 tablets by mouth daily.   Yes [provider]  Multiple Vitamin (MULTI-VITAMINS) TABS Take 1 tablet by mouth daily.   Yes [provider]  Prenatal Vit-Fe Fumarate-FA (PRENATAL MULTIVITAMIN) TABS tablet Take 1 tablet by mouth daily at 12 noon.   Yes [provider]  PULMICORT FLEXHALER 90 MCG/ACT inhaler Inhale 90 mcg into the lungs 2 (two) times daily. Patient not taking: Reported on 09/13/2019 05/14/16   [provider]     [facility]  Social History: She  reports that she has never smoked. She has never used smokeless tobacco. She reports that she does not drink alcohol and does not use drugs.  Family History: family history includes ADD / ADHD in her son; Asthma in her maternal grandmother and son; Bipolar disorder in her mother; Brain cancer in her paternal grandfather; CAD in her paternal grandfather; COPD in her maternal grandmother; Diabetes in her maternal grandfather and paternal grandfather; Diabetes type I in her son; Glaucoma in her paternal grandmother; Heart disease in her paternal grandfather; Irritable bowel syndrome in her mother; Lymphoma in her  paternal grandmother; Macular degeneration in her paternal grandmother; Rheum arthritis in her paternal grandmother; Stroke in her paternal grandfather.   Review of Systems: A full review of systems was performed and negative except as noted in the HPI.     Physical Exam:  Vital Signs: BP 133/78 (BP Location: Left Arm)   Pulse  74   Temp (!) 97.3 F (36.3 C) (Oral)   Resp 18   Ht _0  (1.702 m)   Wt 107.5 kg   LMP 05/03/2019   BMI 37.12 kg/m  Physical Exam  General: no acute distress.  HEENT: normocephalic, atraumatic Heart: regular rate & rhythm.  No murmurs/rubs/gallops Lungs: clear to auscultation bilaterally, normal respiratory effort Abdomen: soft, gravid, non-tender;  EFW: 3398g on 01/19/2020 Pelvic:   External: Normal external female genitalia  Cervix: Dilation: 3.5 / Effacement (%): 70, 80 / Station: -2    Extremities: non-tender, symmetric, no edema bilaterally.  DTRs: 2+/2+  Neurologic: Alert & oriented x 3.    Results for orders placed or performed during the hospital encounter of 01/27/20 (from the past 24 hour(s))  Resp Panel by RT-PCR (Flu A&B, Covid) Nasopharyngeal Swab     Status: None   Collection Time: 01/27/20  5:55 PM   Specimen: Nasopharyngeal Swab; Nasopharyngeal(NP) swabs in vial transport medium  Result Value Ref Range   SARS Coronavirus 2 by RT PCR NEGATIVE NEGATIVE   Influenza A by PCR NEGATIVE NEGATIVE   Influenza B by PCR NEGATIVE NEGATIVE  Protein / creatinine ratio, urine     Status: Abnormal   Collection Time: 01/27/20  6:18 PM  Result Value Ref Range   Creatinine, Urine 80 mg/dL   Total Protein, Urine 18 mg/dL   Protein Creatinine Ratio 0.23 (H) 0.00 - 0.15 mg/mg[Cre]  CBC     Status: Abnormal   Collection Time: 01/27/20  7:01 PM  Result Value Ref Range   WBC 9.3 4.0 - 10.5 K/uL   RBC 3.76 (L) 3.87 - 5.11 MIL/uL   Hemoglobin 11.8 (L) 12.0 - 15.0 g/dL   HCT 34.1 (L) 36 - 46 %   MCV 90.7 80.0 - 100.0 fL   MCH 31.4 26.0 - 34.0 pg   MCHC 34.6 30.0 - 36.0 g/dL   RDW 13.3 11.5 - 15.5 %   Platelets 192 150 - 400 K/uL   nRBC 0.0 0.0 - 0.2 %  Type and screen [place]     Status: None   Collection Time: 01/27/20  7:01 PM  Result Value Ref Range   ABO/RH(D) O POS    Antibody Screen NEG    Sample Expiration      01/30/2020,2359 Performed  at [facility], [address] [place], [place] [address]   Comprehensive metabolic panel     Status: Abnormal   Collection Time: 01/27/20  7:01 PM  Result Value Ref Range   Sodium 138 135 - 145 mmol/L   Potassium 3.1 (L) 3.5 - 5.1 mmol/L   Chloride 109 98 - 111 mmol/L   CO2 20 (L) 22 - 32 mmol/L   Glucose, Bld 81 70 - 99 mg/dL   BUN 7 6 - 20 mg/dL   Creatinine, Ser 0.43 (L) 0.44 - 1.00 mg/dL   Calcium 8.5 (L) 8.9 - 10.3 mg/dL   Total Protein 6.6 6.5 - 8.1 g/dL   Albumin 3.4 (L) 3.5 - 5.0 g/dL   AST 20 15 - 41 U/L   ALT 19 0 - 44 U/L  Alkaline Phosphatase 100 38 - 126 U/L   Total Bilirubin 0.9 0.3 - 1.2 mg/dL   GFR, Estimated >60 >60 mL/min   Anion gap 9 5 - 15    Pertinent Results:  Prenatal Labs: Blood type/Rh O pos  Antibody screen neg  Rubella Immune  Varicella Immune  RPR NR  HBsAg Neg  HIV NR  GC neg  Chlamydia neg  Genetic screening cfDNA negative; AFP neg  1 hour GTT 56  3 hour GTT N/A  GBS Negative   FHT: Baseline: 135 bpm, Variability: moderate, Accelerations: 15x15 present  and Decelerations: Absent TOCO: irregular, every 4-6 minutes SVE:  Dilation: 3.5 / Effacement (%): 70, 80 / Station: -2    Cephalic by [place] and SVE   No results found.  Assessment:  ELIZEBATH WEVER is a 34 y.o. G22P3003 female at 65w3dwith SROM.   Plan:  1. Admit to Labor & Delivery; consents reviewed and obtained - Covid admission screen  - Dr. WLeonides SchanzNotified of admission and plans to augment with oxytocin   2. Fetal Well being  - Fetal Tracing: Cat 1 - Group B Streptococcus ppx not indicated: GBS neg - Presentation: cephalic confirmed by SVE   3. Routine OB: - Prenatal labs reviewed, as above - Rh pos - CBC, T&S, RPR on admit - Reg diet, IVF  4. Elevated blood pressure  - Initial blood pressure was 154/89 - Follow up blood pressures were below mild range - Preeclampsia labs WNL, urine PCR 0.23 - She has not met criteria for gestational HTN but  will continue to monitor blood pressures.  If she has another elevated blood pressure >4hr from the initial one she will meet criteria for gestational hypertension.    5. Monitoring of labor  -  Contractions monitored with external toco -  Pelvis proven to 3300 grams   -  Plan for augmentation with oxytocin  -  Plan for  continuous fetal monitoring -  Maternal pain control as desired; planning regional anesthesia - Anticipate vaginal delivery  5. Post Partum Planning: - Infant feeding: breast - Contraception: BTL - Tdap vaccine: Given 11/21/2019 - Flu vaccine: Given 11/21/2019  AMinda Meo CNM 01/27/20 9:25 PM  ADrinda Butts CNM Certified Nurse Midwife [facility]

## 2020-01-27 NOTE — Anesthesia Preprocedure Evaluation (Signed)
Anesthesia Evaluation  Patient identified by MRN, date of birth, ID band Patient awake    Reviewed: Allergy & Precautions, NPO status , Patient's Chart, lab work & pertinent test results  History of Anesthesia Complications Negative for: history of anesthetic complications  Airway Mallampati: II  TM Distance: >3 FB Neck ROM: Full    Dental no notable dental hx. (+) Teeth Intact   Pulmonary asthma , neg sleep apnea, neg COPD, Patient abstained from smoking.Not current smoker,  Takes inhalers 2-3 times per week. Never hospitalized for asthma   Pulmonary exam normal breath sounds clear to auscultation       Cardiovascular Exercise Tolerance: Good METS(-) hypertension(-) CAD and (-) Past MI negative cardio ROS  (-) dysrhythmias  Rhythm:Regular Rate:Normal - Systolic murmurs    Neuro/Psych negative neurological ROS  negative psych ROS   GI/Hepatic neg GERD  ,(+)     (-) substance abuse  , S/p gastric bypass and significant weight loss   Endo/Other  neg diabetes  Renal/GU negative Renal ROS     Musculoskeletal   Abdominal   Peds  Hematology  (+) anemia ,   Anesthesia Other Findings Past Medical History: No date: Anemia No date: Asthma 11/02/2015: Torsion of fallopian tube  Reproductive/Obstetrics (+) Pregnancy                             Anesthesia Physical Anesthesia Plan  ASA: II  Anesthesia Plan: Epidural   Post-op Pain Management:    Induction:   PONV Risk Score and Plan: 2 and Treatment may vary due to age or medical condition and Ondansetron  Airway Management Planned: Natural Airway  Additional Equipment:   Intra-op Plan:   Post-operative Plan:   Informed Consent: I have reviewed the patients History and Physical, chart, labs and discussed the procedure including the risks, benefits and alternatives for the proposed anesthesia with the patient or authorized  representative who has indicated his/her understanding and acceptance.       Plan Discussed with: Surgeon  Anesthesia Plan Comments: (Discussed R/B/A of neuraxial anesthesia technique with patient: - rare risks of spinal/epidural hematoma, nerve damage, infection - Risk of PDPH - Risk of itching - Risk of nausea and vomiting - Risk of poor block necessitating replacement of epidural. Patient voiced understanding.)        Anesthesia Quick Evaluation

## 2020-01-27 NOTE — Anesthesia Procedure Notes (Signed)
Epidural Patient location during procedure: OB Start time: 01/27/2020 11:03 PM End time: 01/27/2020 11:50 PM  Staffing Anesthesiologist: Corinda Gubler, MD Performed: anesthesiologist   Preanesthetic Checklist Completed: patient identified, IV checked, site marked, risks and benefits discussed, surgical consent, monitors and equipment checked, pre-op evaluation and timeout performed  Epidural Patient position: sitting Prep: ChloraPrep Patient monitoring: heart rate, continuous pulse ox and blood pressure Approach: midline Location: L2-L3 Injection technique: LOR air  Needle:  Needle type: Tuohy  Needle gauge: 17 G Needle length: 9 cm and 9 Needle insertion depth: 8 cm Catheter type: closed end flexible Catheter size: 19 Gauge Catheter at skin depth: 13 cm Test dose: negative and 1.5% lidocaine with Epi 1:200 K  Assessment Sensory level: T10 Events: blood not aspirated, injection not painful, no injection resistance, no paresthesia and negative IV test  Additional Notes first attempt - difficult placement with multiple insertion sites Pt. Evaluated and documentation done after procedure finished. Patient identified. Risks/Benefits/Options discussed with patient including but not limited to bleeding, infection, nerve damage, paralysis, failed block, incomplete pain control, headache, blood pressure changes, nausea, vomiting, reactions to medication both or allergic, itching and postpartum back pain. Confirmed with bedside nurse the patient's most recent platelet count. Confirmed with patient that they are not currently taking any anticoagulation, have any bleeding history or any family history of bleeding disorders. Patient expressed understanding and wished to proceed. All questions were answered. Sterile technique was used throughout the entire procedure. Please see nursing notes for vital signs. Test dose was given through epidural catheter and negative prior to continuing to dose  epidural or start infusion. Warning signs of high block given to the patient including shortness of breath, tingling/numbness in hands, complete motor block, or any concerning symptoms with instructions to call for help. Patient was given instructions on fall risk and not to get out of bed. All questions and concerns addressed with instructions to call with any issues or inadequate analgesia.   Patient tolerated the insertion well without immediate complications.Reason for block:procedure for pain

## 2020-01-28 ENCOUNTER — Encounter: Payer: Self-pay | Admitting: Obstetrics & Gynecology

## 2020-01-28 ENCOUNTER — Inpatient Hospital Stay: Payer: 59 | Attending: Oncology

## 2020-01-28 DIAGNOSIS — O133 Gestational [pregnancy-induced] hypertension without significant proteinuria, third trimester: Secondary | ICD-10-CM | POA: Diagnosis present

## 2020-01-28 LAB — CBC
HCT: 29.5 % — ABNORMAL LOW (ref 36.0–46.0)
Hemoglobin: 10.2 g/dL — ABNORMAL LOW (ref 12.0–15.0)
MCH: 31.1 pg (ref 26.0–34.0)
MCHC: 34.6 g/dL (ref 30.0–36.0)
MCV: 89.9 fL (ref 80.0–100.0)
Platelets: 173 10*3/uL (ref 150–400)
RBC: 3.28 MIL/uL — ABNORMAL LOW (ref 3.87–5.11)
RDW: 13.2 % (ref 11.5–15.5)
WBC: 11 10*3/uL — ABNORMAL HIGH (ref 4.0–10.5)
nRBC: 0 % (ref 0.0–0.2)

## 2020-01-28 LAB — RPR: RPR Ser Ql: NONREACTIVE

## 2020-01-28 MED ORDER — DOCUSATE SODIUM 100 MG PO CAPS
100.0000 mg | ORAL_CAPSULE | Freq: Two times a day (BID) | ORAL | Status: DC
  Administered 2020-01-29: 100 mg via ORAL
  Filled 2020-01-28 (×2): qty 1

## 2020-01-28 MED ORDER — SIMETHICONE 80 MG PO CHEW: 80.0000 mg | CHEWABLE_TABLET | ORAL | Status: DC | PRN

## 2020-01-28 MED ORDER — DIPHENHYDRAMINE HCL 25 MG PO CAPS: 25.0000 mg | ORAL_CAPSULE | Freq: Four times a day (QID) | ORAL | Status: DC | PRN

## 2020-01-28 MED ORDER — ONDANSETRON HCL 4 MG/2ML IJ SOLN: 4.0000 mg | INTRAMUSCULAR | Status: DC | PRN

## 2020-01-28 MED ORDER — COCONUT OIL OIL
1.0000 "application " | TOPICAL_OIL | Status: DC | PRN
  Administered 2020-01-28: 1 via TOPICAL
  Filled 2020-01-28: qty 120

## 2020-01-28 MED ORDER — DIBUCAINE (PERIANAL) 1 % EX OINT: 1.0000 "application " | TOPICAL_OINTMENT | CUTANEOUS | Status: DC | PRN

## 2020-01-28 MED ORDER — FERROUS SULFATE 325 (65 FE) MG PO TABS
325.0000 mg | ORAL_TABLET | Freq: Two times a day (BID) | ORAL | Status: DC
  Administered 2020-01-29: 325 mg via ORAL
  Filled 2020-01-28: qty 1

## 2020-01-28 MED ORDER — ACETAMINOPHEN 500 MG PO TABS
ORAL_TABLET | ORAL | Status: AC
  Filled 2020-01-28: qty 2

## 2020-01-28 MED ORDER — IBUPROFEN 600 MG PO TABS
600.0000 mg | ORAL_TABLET | Freq: Four times a day (QID) | ORAL | Status: DC
  Administered 2020-01-29 (×2): 600 mg via ORAL
  Filled 2020-01-28 (×2): qty 1

## 2020-01-28 MED ORDER — PRENATAL MULTIVITAMIN CH
1.0000 | ORAL_TABLET | Freq: Every day | ORAL | Status: DC
  Administered 2020-01-28 – 2020-01-29 (×2): 1 via ORAL
  Filled 2020-01-28 (×2): qty 1

## 2020-01-28 MED ORDER — IBUPROFEN 600 MG PO TABS
600.0000 mg | ORAL_TABLET | Freq: Four times a day (QID) | ORAL | Status: DC
  Administered 2020-01-28 (×3): 600 mg via ORAL
  Filled 2020-01-28 (×4): qty 1

## 2020-01-28 MED ORDER — ONDANSETRON HCL 4 MG PO TABS: 4.0000 mg | ORAL_TABLET | ORAL | Status: DC | PRN

## 2020-01-28 MED ORDER — BENZOCAINE-MENTHOL 20-0.5 % EX AERO
1.0000 "application " | INHALATION_SPRAY | CUTANEOUS | Status: DC | PRN
  Filled 2020-01-28: qty 56

## 2020-01-28 MED ORDER — ACETAMINOPHEN 500 MG PO TABS
1000.0000 mg | ORAL_TABLET | Freq: Four times a day (QID) | ORAL | Status: DC | PRN
  Administered 2020-01-28 – 2020-01-29 (×4): 1000 mg via ORAL
  Filled 2020-01-28 (×3): qty 2

## 2020-01-28 MED ORDER — WITCH HAZEL-GLYCERIN EX PADS
1.0000 "application " | MEDICATED_PAD | CUTANEOUS | Status: DC
  Administered 2020-01-28: 1 via TOPICAL
  Filled 2020-01-28: qty 100

## 2020-01-28 NOTE — Progress Notes (Signed)
Post Partum Day 0 Subjective: Doing well, no complaints.  Tolerating regular diet, pain with PO meds, voiding and ambulating without difficulty.  No CP SOB Fever,Chills, N/V or leg pain; denies nipple or breast pain; no HA change of vision, RUQ/epigastric pain  Objective: BP 124/73 (BP Location: Left Arm)   Pulse 71   Temp 98.6 F (37 C) (Oral)   Resp 17   Ht 5\' 7"  (1.702 m)   Wt 107.5 kg   LMP 05/03/2019   SpO2 96%   Breastfeeding Unknown   BMI 37.12 kg/m    Physical Exam:  General: NAD Breasts: soft/nontender CV: RRR Pulm: nl effort, CTABL Abdomen: soft, NT, BS x 4 Perineum: minimal edema, lacerations repair well approximated Lochia: small Uterine Fundus: fundus firm and 2 fb below umbilicus DVT Evaluation: no cords, ttp LEs   Recent Labs    01/27/20 1901 01/28/20 0615  HGB 11.8* 10.2*  HCT 34.1* 29.5*  WBC 9.3 11.0*  PLT 192 173    Assessment/Plan: 34 y.o. G4P4004 postpartum day # 0  - Continue routine PP care;  - GHTN: monitor for sx Pre-eclampsia, BP WNL since 0430, may need antihypertensive therapy at DC. Plan repeat labs tomorrow AM.  - Lactation consult prn  - Discussed contraceptive options including implant, IUDs hormonal and non-hormonal, injection, pills/ring/patch, condoms, and NFP.  - chronic iron deficiency anemia and poor absorption due to bariatric surgery- hemodynamically stable and asymptomatic; start po ferrous sulfate BID with stool softeners  - Immunization status:  all Imms up to date    Disposition: Does not desire Dc home today.     14/06/21, CNM 01/28/2020  8:56 AM

## 2020-01-28 NOTE — Lactation Note (Signed)
This note was copied from a baby's chart. Lactation Consultation Note  Patient Name: Brooke Pearson QASTM'H Date: 01/28/2020 Reason for consult: Initial assessment;Early term 37-38.6wks  Observed Brooke Pearson breast feeding.  She has a strong rhythmic suck with an occasional swallow.  Mom has red areas on tips of both nipples.Right nipple is more painful than left. Mom reports it feeling some better since started putting coconut oil on it.  Stressed importance of getting deep latch and not letting Brooke Pearson suck on tip of nipple which is fairly large.  Demonstrated how to hand express and rub colostrum on nipples to prevent bacteria, lubricate and help with healing.  Mom declines comfort gels for now.  Mom attempted to breast feed first baby who had poor latch and ended up engorged.  Last 2 BF for 4 to 6 weeks.  One she had problems with milk supply and other one had a gut issue and ended up on nutramigen for hyerallergies.  Mom already has a Lansinoh pump through her [employer].  Hand out given on what to expect with feedings the first 4 days of life reviewing normal newborn stomach size, supply and demand, adequate intake and out put, normal course of lactation and routine newborn feeding patterns.  Lactation [employer] and [employer] hand out given and reviewed contact numbers, informative web sites and support groups.  Lactation name and number written on white board and encouraged to call with any questions, concerns or assistance.    Maternal Data Formula Feeding for Exclusion: No Has patient been taught Hand Expression?: Yes Does the patient have breastfeeding experience prior to this delivery?: Yes  Feeding Feeding Type: Breast Fed  LATCH Score Latch: Repeated attempts needed to sustain latch, nipple held in mouth throughout feeding, stimulation needed to elicit sucking reflex.  Audible Swallowing: A few with stimulation  Type of Nipple: Everted at rest and after stimulation   Comfort (Breast/Nipple): Filling, red/small blisters or bruises, mild/mod discomfort  Hold (Positioning): Assistance needed to correctly position infant at breast and maintain latch.  LATCH Score: 6  Interventions Interventions: Breast feeding basics reviewed;Assisted with latch;Skin to skin;Breast massage;Hand express;Reverse pressure;Breast compression;Adjust position;Support pillows;Position options;Coconut oil  Lactation Tools Discussed/Used WIC Program: No Information systems manager)   Consult Status Consult Status: Follow-up Date: 01/28/20 Follow-up type: Call as needed    Louis Meckel 01/28/2020, 12:46 PM

## 2020-01-28 NOTE — Plan of Care (Signed)

## 2020-01-28 NOTE — Discharge Instructions (Signed)
Vaginal Delivery, Care After Refer to this sheet in the next few weeks. These discharge instructions provide you with information on caring for yourself after delivery. Your caregiver may also give you specific instructions. Your treatment has been planned according to the most current medical practices available, but problems sometimes occur. Call your caregiver if you have any problems or questions after you go home. HOME CARE INSTRUCTIONS 1. Take over-the-counter or prescription medicines only as directed by your caregiver or pharmacist. 2. Do not drink alcohol, especially if you are breastfeeding or taking medicine to relieve pain. 3. Do not smoke tobacco. 4. Continue to use good perineal care. Good perineal care includes: 1. Wiping your perineum from back to front 2. Keeping your perineum clean. 3. You can do sitz baths twice a day, to help keep this area clean 5. Do not use tampons, douche or have sex until your caregiver says it is okay. 6. Shower only and avoid sitting in submerged water, aside from sitz baths 7. Wear a well-fitting bra that provides breast support. 8. Eat healthy foods. 9. Drink enough fluids to keep your urine clear or pale yellow. 10. Eat high-fiber foods such as whole grain cereals and breads, brown rice, beans, and fresh fruits and vegetables every day. These foods may help prevent or relieve constipation. 11. Avoid constipation with high fiber foods or medications, such as miralax or metamucil 12. Follow your caregiver's recommendations regarding resumption of activities such as climbing stairs, driving, lifting, exercising, or traveling. 13. Talk to your caregiver about resuming sexual activities. Resumption of sexual activities is dependent upon your risk of infection, your rate of healing, and your comfort and desire to resume sexual activity. 14. Try to have someone help you with your household activities and your newborn for at least a few days after you leave  the hospital. 15. Rest as much as possible. Try to rest or take a nap when your newborn is sleeping. 16. Increase your activities gradually. 17. Keep all of your scheduled postpartum appointments. It is very important to keep your scheduled follow-up appointments. At these appointments, your caregiver will be checking to make sure that you are healing physically and emotionally. SEEK MEDICAL CARE IF:   You are passing large clots from your vagina. Save any clots to show your caregiver.  You have a foul smelling discharge from your vagina.  You have trouble urinating.  You are urinating frequently.  You have pain when you urinate.  You have a change in your bowel movements.  You have increasing redness, pain, or swelling near your vaginal incision (episiotomy) or vaginal tear.  You have pus draining from your episiotomy or vaginal tear.  Your episiotomy or vaginal tear is separating.  You have painful, hard, or reddened breasts.  You have a severe headache.  You have blurred vision or see spots.  You feel sad or depressed.  You have thoughts of hurting yourself or your newborn.  You have questions about your care, the care of your newborn, or medicines.  You are dizzy or light-headed.  You have a rash.  You have nausea or vomiting.  You were breastfeeding and have not had a menstrual period within 12 weeks after you stopped breastfeeding.  You are not breastfeeding and have not had a menstrual period by the 12th week after delivery.  You have a fever. SEEK IMMEDIATE MEDICAL CARE IF:   You have persistent pain.  You have chest pain.  You have shortness of breath.    You faint.  You have leg pain.  You have stomach pain.  Your vaginal bleeding saturates two or more sanitary pads in 1 hour. MAKE SURE YOU:   Understand these instructions.  Will watch your condition.  Will get help right away if you are not doing well or get worse. Document Released:  02/06/2000 Document Revised: 06/25/2013 Document Reviewed: 10/06/2011 ExitCare Patient Information 2015 [employer]. This information is not intended to replace advice given to you by your health care provider. Make sure you discuss any questions you have with your health care provider.  Sitz Bath A sitz bath is a warm water bath taken in the sitting position. The water covers only the hips and butt (buttocks). We recommend using one that fits in the toilet, to help with ease of use and cleanliness. It may be used for either healing or cleaning purposes. Sitz baths are also used to relieve pain, itching, or muscle tightening (spasms). The water may contain medicine. Moist heat will help you heal and relax.  HOME CARE  Take 3 to 4 sitz baths a day. 18. Fill the bathtub half-full with warm water. 19. Sit in the water and open the drain a little. 20. Turn on the warm water to keep the tub half-full. Keep the water running constantly. 21. Soak in the water for 15 to 20 minutes. 22. After the sitz bath, pat the affected area dry. GET HELP RIGHT AWAY IF: You get worse instead of better. Stop the sitz baths if you get worse. MAKE SURE YOU:  Understand these instructions.  Will watch your condition.  Will get help right away if you are not doing well or get worse. Document Released: 03/18/2004 Document Revised: 11/03/2011 Document Reviewed: 06/08/2010 ExitCare Patient Information 2015 [employer]. This information is not intended to replace advice given to you by your health care provider. Make sure you discuss any questions you have with your health care provider.    

## 2020-01-28 NOTE — Discharge Summary (Signed)
Obstetrical Discharge Summary  Patient Name: Brooke Pearson DOB: Nov 29, 1985 MRN: [id number]  Date of Admission: 01/27/2020 Date of Delivery: 01/28/2020 Delivered by: Drinda Butts, CNM  Date of Discharge: 01/29/2020  Primary OB: [facility] WGY:[id number]'S last menstrual period was 05/03/2019. EDC Estimated Date of Delivery: 02/07/20 Gestational Age at Delivery: [redacted]w[redacted]d  Antepartum complications:  Pregnancy Issues: 1. History of bariatric surgery  2. Iron-deficiency anemia in pregnancy  3. History of anxiety  Admitting Diagnosis: SROM, gestational hypertension  Secondary Diagnosis: Patient Active Problem List   Diagnosis Date Noted  . Gestational hypertension w/o significant proteinuria in 3rd trimester 01/28/2020  . Delayed delivery after SROM (spontaneous rupture of membranes) 01/27/2020  . Uterine size date discrepancy, third trimester 01/27/2020  . Supervision of high risk pregnancy in third trimester 07/01/2016  . 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 Iron deficiency anemia secondary to inadequate dietary iron intake 12/24/2015  . Maternal iron deficiency anemia affecting pregnancy in third trimester, antepartum 12/18/2015  . Elevated transaminase level 12/11/2015  . Obesity affecting pregnancy in third trimester 12/09/2015  . 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 Asthma 11/29/2014  . Morbid obesity ([facility] 09/25/2013    Augmentation: Pitocin Complications: None Intrapartum complications/course: KDazhapresented to L&D with SROM and irregular contractions. Initial blood pressure was elevated but Preeclampsia labs were negative.  Subsequent blood pressures were in mild range, criteria met for gestational hypertension.  Labor was augmented with oxytocin.  She progressed well to C/C/+2 and pushed quickly over 2 contractions. Delivery Type: spontaneous vaginal delivery Anesthesia: epidural Placenta: spontaneous Laceration: 1st perineal with repair   Episiotomy: none Newborn Data: Live born female "Edyn" Birth Weight:  8#1 APGAR: 8, 8  Newborn Delivery   Birth date/time: 01/28/2020 01:56:00 Delivery type: Vaginal, Spontaneous     Postpartum Procedures: none  Edinburgh:  Edinburgh Postnatal Depression Scale Screening Tool 01/28/2020 01/28/2020  I have been able to laugh and see the funny side of things. 0 (No Data)  I have looked forward with enjoyment to things. 0 -  I have blamed myself unnecessarily when things went wrong. 3 -  I have been anxious or worried for no good reason. 0 -  I have felt scared or panicky for no good reason. 0 -  Things have been getting on top of me. 0 -  I have been so unhappy that I have had difficulty sleeping. 0 -  I have felt sad or miserable. 0 -  I have been so unhappy that I have been crying. 0 -  The thought of harming myself has occurred to me. 0 -  Edinburgh Postnatal Depression Scale Total 3 -     Post partum course:  Patient had an uncomplicated postpartum course.  By time of discharge on PPD#1, her pain was controlled on oral pain medications; she had appropriate lochia and was ambulating, voiding without difficulty and tolerating regular diet. Her BP remained 130-135/80-83 and was started on daily Nifedipine 347mXL due to dx GHTN on admission. She was deemed stable for discharge to home and scheduled for close followup visit for BP check.     Discharge Physical Exam:  BP 133/81 (BP Location: Left Arm)   Pulse 60   Temp 97.6 F (36.4 C) (Oral)   Resp 18   Ht 5' 7"  (1.702 m)   Wt 107.5 kg   LMP 05/03/2019   SpO2 96%   Breastfeeding Unknown   BMI 37.12 kg/m   General: NAD CV: RRR Pulm: CTABL, nl  effort ABD: s/nd/nt, fundus firm and below the umbilicus Lochia: moderate Perineum: minimal edema/repair well approximated DVT Evaluation: LE non-ttp, no evidence of DVT on exam.  Hemoglobin  Date Value Ref Range Status  01/29/2020 9.6 (L) 12.0 - 15.0 g/dL Final   HGB  Date  Value Ref Range Status  06/26/2013 11.2 (L) 12.0 - 16.0 g/dL Final   HCT  Date Value Ref Range Status  01/29/2020 28.2 (L) 36 - 46 % Final  06/26/2013 34.4 (L) 35.0 - 47.0 % Final     Disposition: stable, discharge to home. Baby Feeding: breastmilk Baby Disposition: home with mom  Rh Immune globulin given: O pos  Rubella vaccine given: Immune Varivax vaccine given: Immune  Flu vaccine given in AP or PP setting: given 11/21/2019 Tdap vaccine given in AP or PP setting: given 11/21/2019  Contraception: Postpartum BTL  Prenatal Labs:  Blood type/Rh O pos   Antibody screen neg  Rubella Immune  Varicella Immune  RPR NR  HBsAg Neg  HIV NR  GC neg  Chlamydia neg  Genetic screening cfDNA negative; AFP neg   1 hour GTT 56  3 hour GTT N/A  GBS Negative    Plan:  [name] was discharged to home in good condition. Follow-up appointment with [facility] in 2-3 weeks for preop appointment for BTL and delivering provider in 6 weeks.  Discharge Medications: Allergies as of 01/29/2020      Reactions   Butorphanol Tartrate Nausea And Vomiting   Penicillin V Rash   Sulfa Antibiotics Rash      Medication List    STOP taking these medications   prenatal multivitamin Tabs tablet     TAKE these medications   acetaminophen 500 MG tablet Commonly known as: TYLENOL Take 2 tablets (1,000 mg total) by mouth every 6 (six) hours as needed (for pain scale < 4).   albuterol 108 (90 Base) MCG/ACT inhaler Commonly known as: VENTOLIN HFA Inhale 2 puffs into the lungs every 4 (four) hours as needed for wheezing or shortness of breath.   benzocaine-Menthol 20-0.5 % Aero Commonly known as: DERMOPLAST Apply 1 application topically as needed for irritation (perineal discomfort).   Calcium 600+D3 Plus Minerals 600-800 MG-UNIT Chew Take as directed, per bariatric provider instructions. What changed:   how much to take  how to take this  when to take this  additional instructions    coconut oil Oil Apply 1 application topically as needed.   docusate sodium 100 MG capsule Commonly known as: COLACE Take 1 capsule (100 mg total) by mouth 2 (two) times daily.   ferrous sulfate 325 (65 FE) MG tablet Take 1 tablet (325 mg total) by mouth 2 (two) times daily with a meal.   ibuprofen 600 MG tablet Commonly known as: ADVIL Take 1 tablet (600 mg total) by mouth every 6 (six) hours.   Multi-Vitamins Tabs Take 1 tablet by mouth daily.   NIFEdipine 30 MG 24 hr tablet Commonly known as: PROCARDIA-XL/NIFEDICAL-XL Take 1 tablet (30 mg total) by mouth daily.   Pulmicort Flexhaler 90 MCG/ACT inhaler Generic drug: Budesonide Inhale 90 mcg into the lungs 2 (two) times daily.   simethicone 80 MG chewable tablet Commonly known as: MYLICON Chew 1 tablet (80 mg total) by mouth as needed for flatulence.   witch hazel-glycerin pad Commonly known as: TUCKS Apply 1 application topically continuous.        Follow-up Information    Minda Meo, CNM. Schedule an appointment as soon as  possible for a visit in 2 day(s).   Specialty: Certified Nurse Midwife Why: BP recheck- video visit okay 2-3days Contact information: [place] [address] [phone]        [facility]. Schedule an appointment as soon as possible for a visit in 2 week(s).   Why: pre-op visit for BTL with Dr Sallyanne Kuster information: [address] [place] [phone]              Signed: Francetta Found, CNM 01/29/2020 10:50 AM

## 2020-01-29 LAB — COMPREHENSIVE METABOLIC PANEL
ALT: 18 U/L (ref 0–44)
AST: 18 U/L (ref 15–41)
Albumin: 2.5 g/dL — ABNORMAL LOW (ref 3.5–5.0)
Alkaline Phosphatase: 79 U/L (ref 38–126)
Anion gap: 8 (ref 5–15)
BUN: 12 mg/dL (ref 6–20)
CO2: 20 mmol/L — ABNORMAL LOW (ref 22–32)
Calcium: 8 mg/dL — ABNORMAL LOW (ref 8.9–10.3)
Chloride: 109 mmol/L (ref 98–111)
Creatinine, Ser: 0.48 mg/dL (ref 0.44–1.00)
GFR, Estimated: >60 mL/min (ref >60)
Glucose, Bld: 70 mg/dL (ref 70–99)
Potassium: 3.2 mmol/L — ABNORMAL LOW (ref 3.5–5.1)
Sodium: 137 mmol/L (ref 135–145)
Total Bilirubin: 0.5 mg/dL (ref 0.3–1.2)
Total Protein: 5 g/dL — ABNORMAL LOW (ref 6.5–8.1)

## 2020-01-29 LAB — CBC
HCT: 28.2 % — ABNORMAL LOW (ref 36.0–46.0)
Hemoglobin: 9.6 g/dL — ABNORMAL LOW (ref 12.0–15.0)
MCH: 31.1 pg (ref 26.0–34.0)
MCHC: 34 g/dL (ref 30.0–36.0)
MCV: 91.3 fL (ref 80.0–100.0)
Platelets: 159 10*3/uL (ref 150–400)
RBC: 3.09 MIL/uL — ABNORMAL LOW (ref 3.87–5.11)
RDW: 13.4 % (ref 11.5–15.5)
WBC: 7.7 10*3/uL (ref 4.0–10.5)
nRBC: 0 % (ref 0.0–0.2)

## 2020-01-29 MED ORDER — DOCUSATE SODIUM 100 MG PO CAPS: 100.0000 mg | ORAL_CAPSULE | Freq: Two times a day (BID) | ORAL | 0 refills | Status: DC

## 2020-01-29 MED ORDER — WITCH HAZEL-GLYCERIN EX PADS: 1.0000 "application " | MEDICATED_PAD | CUTANEOUS | 12 refills | Status: DC

## 2020-01-29 MED ORDER — BENZOCAINE-MENTHOL 20-0.5 % EX AERO: 1.0000 "application " | INHALATION_SPRAY | CUTANEOUS | Status: DC | PRN

## 2020-01-29 MED ORDER — SIMETHICONE 80 MG PO CHEW: 80.0000 mg | CHEWABLE_TABLET | ORAL | 0 refills | Status: DC | PRN

## 2020-01-29 MED ORDER — ACETAMINOPHEN 500 MG PO TABS: 1000.0000 mg | ORAL_TABLET | Freq: Four times a day (QID) | ORAL | 0 refills | Status: AC | PRN

## 2020-01-29 MED ORDER — CALCIUM 600+D3 PLUS MINERALS 600-800 MG-UNIT PO CHEW: CHEWABLE_TABLET | ORAL | Status: AC

## 2020-01-29 MED ORDER — FERROUS SULFATE 325 (65 FE) MG PO TABS: 325.0000 mg | ORAL_TABLET | Freq: Two times a day (BID) | ORAL | 1 refills | Status: DC

## 2020-01-29 MED ORDER — NIFEDIPINE ER OSMOTIC RELEASE 30 MG PO TB24
30.0000 mg | ORAL_TABLET | Freq: Every day | ORAL | Status: DC
  Administered 2020-01-29: 30 mg via ORAL
  Filled 2020-01-29: qty 1

## 2020-01-29 MED ORDER — IBUPROFEN 600 MG PO TABS: 600.0000 mg | ORAL_TABLET | Freq: Four times a day (QID) | ORAL | 0 refills | Status: AC

## 2020-01-29 MED ORDER — NIFEDIPINE ER OSMOTIC RELEASE 30 MG PO TB24: 30.0000 mg | ORAL_TABLET | Freq: Every day | ORAL | 0 refills | Status: AC

## 2020-01-29 MED ORDER — COCONUT OIL OIL: 1.0000 "application " | TOPICAL_OIL | 0 refills | Status: DC | PRN

## 2020-01-29 NOTE — Lactation Note (Signed)
This note was copied from a baby's chart. Lactation Consultation Note  Patient Name: Brooke Pearson [id number] Date: 01/29/2020 Reason for consult: Follow-up assessment;Early term 37-38.6wks  LC and LC student in to see mom and baby before anticipated discharge. This is mom's 4th baby, previous BF experience 4-6 weeks due to low milk supply. Mom reports feedings to be going well, slight nipple discomfort due to early shallow latches, but getting baby deeper the last several feeds. Mom has coconut oil and is feeling better.  LC student provided BF basics and what to expect in the days and weeks to come with breastfeeding. LC stressed the importance of putting baby to breast with all cues, milk supply and demand, and normal course of lactation.  Information given for breast fullness and engorgement and management of both, and nipple care. Mom was encouraged to continue tracking output, and provided with other signs of adequate intake. Encouraged deep latch and good alignment with each feed to ensure good transfer and minimize possibility of nipple pain/discomfort.  Mom was given information for outpatient services and community breastfeeding resources.  Maternal Data Formula Feeding for Exclusion: No Has patient been taught Hand Expression?: Yes Does the patient have breastfeeding experience prior to this delivery?: Yes  Feeding Feeding Type: Breast Fed  LATCH Score                   Interventions Interventions: Breast feeding basics reviewed  Lactation Tools Discussed/Used     Consult Status Consult Status: Complete Date: 01/29/20 Follow-up type: Call as needed    Danford Bad 01/29/2020, 9:52 AM

## 2020-01-29 NOTE — Anesthesia Postprocedure Evaluation (Signed)
Anesthesia Post Note  Patient: Brooke Pearson  Procedure(s) Performed: AN AD HOC LABOR EPIDURAL  Patient location during evaluation: Mother Baby Anesthesia Type: Epidural Level of consciousness: oriented and awake and alert Pain management: pain level controlled Vital Signs Assessment: post-procedure vital signs reviewed and stable Respiratory status: spontaneous breathing and respiratory function stable Cardiovascular status: blood pressure returned to baseline and stable Postop Assessment: no headache, no backache, no apparent nausea or vomiting and able to ambulate Anesthetic complications: no   No complications documented.   Last Vitals:  Vitals:   01/28/20 1622 01/28/20 2320  BP: 131/81 130/83  Pulse: 67 67  Resp: 18 18  Temp: 36.8 C 36.9 C  SpO2: 99% 99%    Last Pain:  Vitals:   01/28/20 2320  TempSrc: Oral  PainSc:                  Starling Manns

## 2020-01-29 NOTE — Progress Notes (Signed)
Patient discharged home with infant. Discharge instructions and prescriptions given and reviewed with patient. Patient verbalized understanding. Escorted out by auxillary.  

## 2020-02-23 DIAGNOSIS — Z419 Encounter for procedure for purposes other than remedying health state, unspecified: Secondary | ICD-10-CM | POA: Diagnosis not present

## 2020-03-17 ENCOUNTER — Other Ambulatory Visit: Payer: 59

## 2020-03-25 DIAGNOSIS — Z419 Encounter for procedure for purposes other than remedying health state, unspecified: Secondary | ICD-10-CM | POA: Diagnosis not present

## 2020-04-09 ENCOUNTER — Inpatient Hospital Stay: Payer: 59 | Attending: Oncology

## 2020-04-11 ENCOUNTER — Inpatient Hospital Stay: Payer: 59 | Admitting: Oncology

## 2020-04-11 NOTE — Progress Notes (Unsigned)
Attempted to call and got her voicemail and left message that we were contacting her for the video visit

## 2020-04-14 ENCOUNTER — Telehealth: Payer: Self-pay | Admitting: Oncology

## 2020-04-14 NOTE — Telephone Encounter (Signed)
04/14/2020  Spoke w/ pt and informed her of missed appt on 04/11/20. Made new appts for labs/virtual MD visit for 3/23 and 3/25 per pts request SRW

## 2020-04-22 DIAGNOSIS — Z419 Encounter for procedure for purposes other than remedying health state, unspecified: Secondary | ICD-10-CM | POA: Diagnosis not present

## 2020-05-14 ENCOUNTER — Inpatient Hospital Stay: Payer: 59 | Attending: Oncology

## 2020-05-16 ENCOUNTER — Inpatient Hospital Stay: Payer: 59 | Admitting: Oncology

## 2020-05-23 DIAGNOSIS — Z419 Encounter for procedure for purposes other than remedying health state, unspecified: Secondary | ICD-10-CM | POA: Diagnosis not present

## 2020-06-22 DIAGNOSIS — Z419 Encounter for procedure for purposes other than remedying health state, unspecified: Secondary | ICD-10-CM | POA: Diagnosis not present

## 2020-07-23 DIAGNOSIS — Z419 Encounter for procedure for purposes other than remedying health state, unspecified: Secondary | ICD-10-CM | POA: Diagnosis not present

## 2020-08-22 DIAGNOSIS — Z419 Encounter for procedure for purposes other than remedying health state, unspecified: Secondary | ICD-10-CM | POA: Diagnosis not present

## 2021-01-30 ENCOUNTER — Encounter: Payer: Self-pay | Admitting: Oncology

## 2022-08-23 ENCOUNTER — Ambulatory Visit: Payer: Self-pay

## 2022-08-23 ENCOUNTER — Encounter: Payer: Self-pay | Admitting: Oncology

## 2023-06-29 ENCOUNTER — Encounter: Payer: Self-pay | Admitting: Emergency Medicine

## 2023-06-29 ENCOUNTER — Ambulatory Visit
Admission: EM | Admit: 2023-06-29 | Discharge: 2023-06-29 | Disposition: A | Discharge status: Home / Self Care | Attending: Family Medicine | Admitting: Family Medicine

## 2023-06-29 ENCOUNTER — Encounter: Payer: Self-pay | Admitting: Oncology

## 2023-06-29 ENCOUNTER — Ambulatory Visit (INDEPENDENT_AMBULATORY_CARE_PROVIDER_SITE_OTHER)

## 2023-06-29 DIAGNOSIS — R051 Acute cough: Secondary | ICD-10-CM | POA: Diagnosis not present

## 2023-06-29 DIAGNOSIS — J4521 Mild intermittent asthma with (acute) exacerbation: Secondary | ICD-10-CM | POA: Diagnosis not present

## 2023-06-29 DIAGNOSIS — Z87891 Personal history of nicotine dependence: Secondary | ICD-10-CM

## 2023-06-29 MED ORDER — ALBUTEROL SULFATE HFA 108 (90 BASE) MCG/ACT IN AERS: 2.0000 | INHALATION_SPRAY | RESPIRATORY_TRACT | 0 refills | Status: AC | PRN

## 2023-06-29 MED ORDER — DEXAMETHASONE SODIUM PHOSPHATE 10 MG/ML IJ SOLN
10.0000 mg | Freq: Once | INTRAMUSCULAR | Status: AC
  Administered 2023-06-29: 10 mg via INTRAMUSCULAR

## 2023-06-29 MED ORDER — PREDNISONE 10 MG (21) PO TBPK: ORAL_TABLET | Freq: Every day | ORAL | 0 refills | Status: DC

## 2023-06-29 MED ORDER — AZITHROMYCIN 250 MG PO TABS: ORAL_TABLET | ORAL | 0 refills | Status: DC

## 2023-06-29 MED ORDER — ALBUTEROL SULFATE (2.5 MG/3ML) 0.083% IN NEBU
2.5000 mg | INHALATION_SOLUTION | Freq: Once | RESPIRATORY_TRACT | Status: AC
  Administered 2023-06-29: 2.5 mg via RESPIRATORY_TRACT

## 2023-06-29 NOTE — ED Triage Notes (Signed)
 Pt presents with a cough, chest congestion, SOB and hoarse x 1 week. Pt has taken tessalon and allergy medication with no relief. The past few days she has used her inhaler more than normal.

## 2023-06-29 NOTE — Discharge Instructions (Addendum)
 Your chest xray did not show evidence of pneumonia  though the radiologist has not yet read it. If they find something that I didn't, I will call you.    Stop by the pharmacy to pick up your prescriptions.  Follow up with your primary care provider or return to the urgent care, if not improving.

## 2023-06-29 NOTE — ED Provider Notes (Signed)
 [facility]    CSN: [id number] Arrival date & time: 06/29/23  1231      History   Chief Complaint Chief Complaint  Patient presents with   Cough   Shortness of Breath   Hoarse    HPI Brooke Pearson is a 38 y.o. female.   HPI  History obtained from the patient. Brooke Pearson presents for a week of respiratory symptoms. Took ibuprofen  and nasal spray. Had headache, sore throat and nasal congestion about a week ago.  Has nagging cough that gets worse at night. Has been using her albuterol  inhaler every 1-5 hours. Has chest tightness. Cough is mostly dry but can be productive.   Has not been resting more. 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     Has environmental allergies and asthma. Former smoker but quit 10 years ago.        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   Iron  deficiency anemia secondary to inadequate dietary iron  intake 12/24/2015   Maternal iron  deficiency anemia affecting pregnancy in third trimester, antepartum 12/18/2015   Elevated transaminase level 12/11/2015   Obesity affecting pregnancy in third trimester 12/09/2015   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   Asthma 11/29/2014   Morbid obesity (H[facility]) 09/25/2013    Past Surgical History:  Procedure Laterality Date   ABDOMINAL SURGERY  aug 2015   gastric bypass   CHOLECYSTECTOMY     GASTRIC BYPASS  aug 2015   HERNIA REPAIR  aug 2015   LAPAROSCOPIC APPENDECTOMY N/A 11/02/2015   Procedure: APPENDECTOMY LAPAROSCOPIC;   Surgeon: Rhina Center III, MD;  Location: [facility] ORS;  Service: General;  Laterality: N/A;   LAPAROSCOPIC UNILATERAL SALPINGECTOMY Right 11/02/2015   Procedure: LAPAROSCOPIC RIGHT PARTIAL SALPINGECTOMY;  Surgeon: Rhina Center III, MD;  Location: [facility] ORS;  Service: General;  Laterality: Right;  Performed by Dr. Clemetine Cypher     OB History     Gravida  4   Para  4   Term  4   Preterm  0   AB      Living  4      SAB      IAB      Ectopic      Multiple  0   Live Births  4            Home Medications    Prior to Admission medications   Medication Sig Start Date End Date Taking? Authorizing Provider  azithromycin (ZITHROMAX Z-PAK) 250 MG tablet Take 2 tablets on day 1 then 1 tablet daily 06/29/23  Yes Sabra Sessler, DO  busPIRone (BUSPAR) 5 MG tablet Take 1 tablet by mouth 2 (two) times daily. 11/11/22  Yes [provider]  predniSONE (STERAPRED UNI-PAK 21 TAB) 10 MG (21) TBPK tablet Take by mouth daily. Take 6 tabs by mouth daily for 1, then 5 tabs for 1 day, then 4 tabs for 1 day, then 3 tabs for 1 day, then 2 tabs for 1 day, then 1 tab for 1 day. 06/29/23  Yes  Hardin Hardenbrook, DO  acetaminophen  (TYLENOL ) 500 MG tablet Take 2 tablets (1,000 mg total) by mouth every 6 (six) hours as needed (for pain scale < 4). 01/29/20   McVey, Georges Kings, CNM  albuterol  (VENTOLIN  HFA) 108 (90 Base) MCG/ACT inhaler Inhale 2 puffs into the lungs every 4 (four) hours as needed for wheezing or shortness of breath. 06/29/23   Maicol Bowland, DO  benzocaine -Menthol  (DERMOPLAST) 20-0.5 % AERO Apply 1 application topically as needed for irritation (perineal discomfort). 01/29/20   McVey, Georges Kings, CNM  Calcium  Carbonate-Vit D-Min (CALCIUM  600+D3 PLUS MINERALS) 600-800 MG-UNIT CHEW Take as directed, per bariatric provider instructions. 01/29/20   McVey, Georges Kings, CNM  Cholecalciferol (D 1000) 25 MCG (1000 UT) capsule Take 1,000 Units by mouth.    [provider]  coconut oil OIL Apply 1  application topically as needed. 01/29/20   McVey, Georges Kings, CNM  docusate sodium  (COLACE) 100 MG capsule Take 1 capsule (100 mg total) by mouth 2 (two) times daily. 01/29/20   McVey, Georges Kings, CNM  ferrous sulfate  325 (65 FE) MG tablet Take 1 tablet (325 mg total) by mouth 2 (two) times daily with a meal. 01/29/20   McVey, Georges Kings, CNM  ibuprofen  (ADVIL ) 600 MG tablet Take 1 tablet (600 mg total) by mouth every 6 (six) hours. 01/29/20   McVey, Georges Kings, CNM  Multiple Vitamin (MULTI-VITAMINS) TABS Take 1 tablet by mouth daily.    [provider]  PULMICORT FLEXHALER 90 MCG/ACT inhaler Inhale 90 mcg into the lungs 2 (two) times daily. Patient not taking: Reported on 09/13/2019 05/14/16   [provider]  simethicone  (MYLICON) 80 MG chewable tablet Chew 1 tablet (80 mg total) by mouth as needed for flatulence. 01/29/20   McVey, Georges Kings, CNM  VYVANSE 30 MG capsule Take 30 mg by mouth every morning.    [provider]  WEGOVY 0.5 MG/0.5ML SOAJ Inject 0.5 mg into the skin once a week.    [provider]  witch hazel-glycerin  (TUCKS) pad Apply 1 application topically continuous. 01/29/20   McVey, Georges Kings, CNM    Family History Family History  Problem Relation Age of Onset   Bipolar disorder Mother    Irritable bowel syndrome Mother    Asthma Maternal Grandmother    COPD Maternal Grandmother    Diabetes Maternal Grandfather    Rheum arthritis Paternal Grandmother    Glaucoma Paternal Grandmother    Macular degeneration Paternal Grandmother    Lymphoma Paternal Grandmother    Brain cancer Paternal Grandfather    Heart disease Paternal Grandfather    CAD Paternal Grandfather    Diabetes Paternal Grandfather    Stroke Paternal Grandfather    Diabetes type I Son    ADD / ADHD Son    Asthma Son     Social History Social History   Tobacco Use   Smoking status: Never   Smokeless tobacco: Never  Vaping Use   Vaping status: Never Used  Substance Use  Topics   Alcohol use: No    Comment: social   Drug use: No     Allergies   Butorphanol tartrate, Penicillin v, and Sulfa antibiotics   Review of Systems Review of Systems: negative unless otherwise stated in HPI.      Physical Exam Triage Vital Signs ED Triage Vitals  Encounter Vitals Group     BP 06/29/23 1257 107/75     Systolic BP Percentile --      Diastolic BP  Percentile --      Pulse Rate 06/29/23 1257 87     Resp 06/29/23 1257 18     Temp 06/29/23 1257 99 F (37.2 C)     Temp Source 06/29/23 1257 Oral     SpO2 06/29/23 1257 98 %     Weight --      Height --      Head Circumference --      Peak Flow --      Pain Score 06/29/23 1255 0     Pain Loc --      Pain Education --      Exclude from Growth Chart --    No data found.  Updated Vital Signs BP 107/75 (BP Location: Right Arm)   Pulse 87   Temp 99 F (37.2 C) (Oral)   Resp 18   LMP 06/02/2023 (Approximate)   SpO2 98%   Visual Acuity Right Eye Distance:   Left Eye Distance:   Bilateral Distance:    Right Eye Near:   Left Eye Near:    Bilateral Near:     Physical Exam GEN:     alert, non-toxic appearing female in no distress    HENT:  mucus membranes moist, oropharyngeal without lesions or erythema, no tonsillar hypertrophy or exudates, no nasal discharge, bilateral TM normal EYES:   no scleral injection or discharge NECK:  normal ROM, no lymphadenopathy, no meningismus   RESP:  no increased work of breathing, wheezing bilaterally CVS:   regular rate and rhythm Skin:   warm and dry    UC Treatments / Results  Labs (all labs ordered are listed, but only abnormal results are displayed) Labs Reviewed - No data to display  EKG   Radiology DG Chest 2 View Result Date: 06/29/2023 CLINICAL DATA:  Cough. EXAM: CHEST - 2 VIEW COMPARISON:  06/26/2013. FINDINGS: The heart size and mediastinal contours are within normal limits. No focal consolidation, pleural effusion, or pneumothorax. No acute  osseous abnormality. IMPRESSION: No active cardiopulmonary disease. Electronically Signed   By: Mannie Seek M.D.   On: 06/29/2023 15:04    Procedures Procedures (including critical care time)  Medications Ordered in UC Medications  albuterol  (PROVENTIL ) (2.5 MG/3ML) 0.083% nebulizer solution 2.5 mg (2.5 mg Nebulization Given 06/29/23 1338)  dexamethasone  (DECADRON ) injection 10 mg (10 mg Intramuscular Given 06/29/23 1339)    Initial Impression / Assessment and Plan / UC Course  I have reviewed the triage vital signs and the nursing notes.  Pertinent labs & imaging results that were available during my care of the patient were reviewed by me and considered in my medical decision making (see chart for details).      Pt is a 39 y.o. female who has asthma and is a current smoker presents for 1 week of cough that is not improving.  Berlinda is afebrile here without recent antipyretics. Satting well on room air. Overall pt is  non-toxic appearing, well hydrated, without respiratory distress. Pulmonary exam is remarkable for expiratory wheezing and cough.  After shared decision making, we will pursue chest x-ray.  COVID  and influenza testing deferred due to length of symptoms.   Discussed Decadron  10 mg IM and albuterol  nebulizer and she is agreeable. On reassessment lungs are clear. Chest xray personally reviewed by me without focal pneumonia, pleural effusion, cardiomegaly or pneumothorax. Patient aware the radiologist has not read her xray and is comfortable with the preliminary read by me. Will review radiologist read when available and  call patient if a change in plan is warranted.  Pt agreeable to this plan prior to discharge.   Treat presumed asthmatic bronchitis with steroids and antibiotics as below.  Albuterol  inhaler prescribed.  Typical duration of symptoms discussed. Return and ED precautions given and patient voiced understanding.   Discussed MDM, treatment plan and plan for  follow-up with patient who agrees with plan.     Final Clinical Impressions(s) / UC Diagnoses   Final diagnoses:  Acute cough  Mild intermittent asthma with acute exacerbation  Former smoker     Discharge Instructions      Your chest xray did not show evidence of pneumonia though the radiologist has not yet read it. If they find something that I didn't, I will call you.    Stop by the pharmacy to pick up your prescriptions.  Follow up with your primary care provider or return to the urgent care, if not improving.       ED Prescriptions     Medication Sig Dispense Auth. Provider   albuterol  (VENTOLIN  HFA) 108 (90 Base) MCG/ACT inhaler Inhale 2 puffs into the lungs every 4 (four) hours as needed for wheezing or shortness of breath. 6.7 g Kyndle Schlender, DO   azithromycin (ZITHROMAX Z-PAK) 250 MG tablet Take 2 tablets on day 1 then 1 tablet daily 6 tablet Zema Lizardo, DO   predniSONE (STERAPRED UNI-PAK 21 TAB) 10 MG (21) TBPK tablet Take by mouth daily. Take 6 tabs by mouth daily for 1, then 5 tabs for 1 day, then 4 tabs for 1 day, then 3 tabs for 1 day, then 2 tabs for 1 day, then 1 tab for 1 day. 21 tablet Breyer Tejera, DO      PDMP not reviewed this encounter.   Fidel Huddle, DO 06/29/23 1612

## 2023-09-15 ENCOUNTER — Other Ambulatory Visit: Payer: Self-pay | Admitting: Podiatry

## 2023-09-21 ENCOUNTER — Other Ambulatory Visit: Payer: Self-pay

## 2023-09-21 ENCOUNTER — Encounter
Admission: RE | Admit: 2023-09-21 | Discharge: 2023-09-21 | Disposition: A | Discharge status: Home / Self Care | Source: Ambulatory Visit | Attending: Podiatry | Admitting: Podiatry

## 2023-09-21 DIAGNOSIS — Z01812 Encounter for preprocedural laboratory examination: Secondary | ICD-10-CM

## 2023-09-21 HISTORY — DX: Depression, unspecified: F32.A

## 2023-09-21 HISTORY — DX: Other specified postprocedural states: Z98.890

## 2023-09-21 HISTORY — DX: Displaced fracture of fifth metatarsal bone, right foot, initial encounter for closed fracture: S92.351A

## 2023-09-21 HISTORY — DX: Attention-deficit hyperactivity disorder, unspecified type: F90.9

## 2023-09-21 HISTORY — DX: Dyspnea, unspecified: R06.00

## 2023-09-21 HISTORY — DX: Anxiety disorder, unspecified: F41.9

## 2023-09-21 HISTORY — DX: Personal history of other diseases of the digestive system: Z87.19

## 2023-09-21 NOTE — Patient Instructions (Addendum)
 Your procedure is scheduled on: 10/06/23 -  Thursday Report to the Registration Desk on the 1st floor of the [facility]. To find out your arrival time, please call [phone] between 1PM - 3PM on: 10/05/23 - Wednesday If your arrival time is 6:00 am, do not arrive before that time as the [facility] entrance doors do not open until 6:00 am.  REMEMBER: Instructions that are not followed completely may result in serious medical risk, up to and including death; or upon the discretion of your surgeon and anesthesiologist your surgery may need to be rescheduled.  Do not eat food after midnight the night before surgery.  No gum chewing or hard candies.  You may however, drink CLEAR liquids up to 2 hours before you are scheduled to arrive for your surgery. Do not drink anything within 2 hours of your scheduled arrival time.  Clear liquids include: - water  - apple juice without pulp - gatorade (not RED colors) - black coffee or tea (Do NOT add milk or creamers to the coffee or tea) Do NOT drink anything that is not on this list.   In addition, your doctor has ordered for you to drink the provided:  Ensure Pre-Surgery Clear Carbohydrate Drink  Drinking this carbohydrate drink up to two hours before surgery helps to reduce insulin resistance and improve patient outcomes. Please complete drinking 2 hours before scheduled arrival time.  One week prior to surgery: Stop beginning 09/29/23, Anti-inflammatories (NSAIDS) such as Advil , Aleve, Ibuprofen , Motrin , Naproxen, Naprosyn and Aspirin based products such as Excedrin, Goody's Powder, BC Powder. You may take Tylenol  if needed for pain up until the day of surgery.  Stop beginning 09/29/23, ANY OVER THE COUNTER supplements until after surgery.  Semaglutide - hold 7 days prior to your surgery.  ON THE DAY OF SURGERY ONLY TAKE THESE MEDICATIONS WITH SIPS OF WATER:  busPIRone (BUSPAR)  VYVANSE   Use inhalers albuterol  (VENTOLIN  HFA) on  the day of surgery and bring to the hospital.   No Alcohol for 24 hours before or after surgery.  No Smoking including e-cigarettes for 24 hours before surgery.  No chewable tobacco products for at least 6 hours before surgery.  No nicotine patches on the day of surgery.  Do not use any recreational drugs for at least a week (preferably 2 weeks) before your surgery.  Please be advised that the combination of cocaine and anesthesia may have negative outcomes, up to and including death. If you test positive for cocaine, your surgery will be cancelled.  On the morning of surgery brush your teeth with toothpaste and water, you may rinse your mouth with mouthwash if you wish. Do not swallow any toothpaste or mouthwash.  Use CHG Soap or wipes as directed on instruction sheet.  Do not wear jewelry, make-up, hairpins, clips or nail polish.  For welded (permanent) jewelry: bracelets, anklets, waist bands, etc.  Please have this removed prior to surgery.  If it is not removed, there is a chance that hospital personnel will need to cut it off on the day of surgery.  Do not wear lotions, powders, or perfumes.   Do not shave body hair from the neck down 48 hours before surgery.  Contact lenses, hearing aids and dentures may not be worn into surgery.  Do not bring valuables to the hospital. [facility] is not responsible for any missing/lost belongings or valuables.  Notify your doctor if there is any change in your medical condition (cold, fever,  infection).  Wear comfortable clothing (specific to your surgery type) to the hospital.  After surgery, you can help prevent lung complications by doing breathing exercises.  Take deep breaths and cough every 1-2 hours. Your doctor may order a device called an Incentive Spirometer to help you take deep breaths.  When coughing or sneezing, hold a pillow firmly against your incision with both hands. This is called "splinting." Doing this helps protect  your incision. It also decreases belly discomfort.  If you are being admitted to the hospital overnight, leave your suitcase in the car. After surgery it may be brought to your room.  In case of increased patient census, it may be necessary for you, the patient, to continue your postoperative care in the Same Day Surgery department.  If you are being discharged the day of surgery, you will not be allowed to drive home. You will need a responsible individual to drive you home and stay with you for 24 hours after surgery.   If you are taking public transportation, you will need to have a responsible individual with you.  Please call the Pre-admissions Testing Dept. at [phone] if you have any questions about these instructions.  Surgery Visitation Policy:  Patients having surgery or a procedure may have two visitors.  Children under the age of 49 must have an adult with them who is not the patient.  Inpatient Visitation:    Visiting hours are 7 a.m. to 8 p.m. Up to four visitors are allowed at one time in a patient room. The visitors may rotate out with other people during the day.  One visitor age 85 or older may stay with the patient overnight and must be in the room by 8 p.m.   Merchandiser, retail to address health-related social needs:  [email or web address]

## 2023-09-27 ENCOUNTER — Encounter: Payer: Self-pay | Admitting: Podiatry

## 2023-09-27 NOTE — Anesthesia Preprocedure Evaluation (Addendum)
 Anesthesia Evaluation  Patient identified by MRN, date of birth, ID band Patient awake    Reviewed: Allergy & Precautions, H&P , NPO status , Patient's Chart, lab work & pertinent test results  History of Anesthesia Complications (+) PONV and history of anesthetic complications  Airway Mallampati: III  TM Distance: >3 FB Neck ROM: Full    Dental no notable dental hx.    Pulmonary shortness of breath, asthma , former smoker   Pulmonary exam normal breath sounds clear to auscultation       Cardiovascular hypertension, Normal cardiovascular exam Rhythm:Regular Rate:Normal     Neuro/Psych  PSYCHIATRIC DISORDERS Anxiety Depression    negative neurological ROS  negative psych ROS   GI/Hepatic negative GI ROS, Neg liver ROS, hiatal hernia,,,  Endo/Other  negative endocrine ROS    Renal/GU negative Renal ROS  negative genitourinary   Musculoskeletal negative musculoskeletal ROS (+)    Abdominal   Peds negative pediatric ROS (+)  Hematology negative hematology ROS (+) Blood dyscrasia, anemia   Anesthesia Other Findings Asthma  Anemia Torsion of fallopian tube  Closed fracture of base of fifth metatarsal bone of right foot PONV (postoperative nausea and vomiting) Dyspnea History of hiatal hernia  Anxiety ADHD (attention deficit hyperactivity disorder) Hypertension Fatty liver disease, nonalcoholic  Morbid obesity with BMI of 40.0-44.9, adult History of sleeve gastrectomy Generalized anxiety disorder Moderate major depression (HCC) Bronchitis with bronchospasm Postsurgical malabsorption  Has negative pregnancy test today   Reproductive/Obstetrics negative OB ROS                              Anesthesia Physical Anesthesia Plan  ASA: 3  Anesthesia Plan: General ETT   Post-op Pain Management:    Induction: Intravenous, Cricoid pressure planned and Rapid sequence  PONV Risk Score and  Plan:   Airway Management Planned: Oral ETT  Additional Equipment:   Intra-op Plan:   Post-operative Plan: Extubation in OR  Informed Consent: I have reviewed the patients History and Physical, chart, labs and discussed the procedure including the risks, benefits and alternatives for the proposed anesthesia with the patient or authorized representative who has indicated his/her understanding and acceptance.     Dental Advisory Given  Plan Discussed with: Anesthesiologist, CRNA and Surgeon  Anesthesia Plan Comments: (Patient consented for risks of anesthesia including but not limited to:  - adverse reactions to medications - damage to eyes, teeth, lips or other oral mucosa - nerve damage due to positioning  - sore throat or hoarseness - Damage to heart, brain, nerves, lungs, other parts of body or loss of life  Patient voiced understanding and assent.)         Anesthesia Quick Evaluation

## 2023-09-29 NOTE — Discharge Instructions (Signed)

## 2023-10-06 HISTORY — DX: Fatty (change of) liver, not elsewhere classified: K76.0

## 2023-10-06 HISTORY — DX: Acquired absence of stomach (part of): Z90.3

## 2023-10-06 HISTORY — DX: Major depressive disorder, single episode, moderate: F32.1

## 2023-10-06 HISTORY — DX: Postsurgical malabsorption, not elsewhere classified: K91.2

## 2023-10-06 HISTORY — DX: Generalized anxiety disorder: F41.1

## 2023-10-06 HISTORY — DX: Essential (primary) hypertension: I10

## 2023-10-06 HISTORY — DX: Acute bronchitis, unspecified: J20.9

## 2023-10-06 HISTORY — DX: Morbid (severe) obesity due to excess calories: E66.01

## 2023-10-13 ENCOUNTER — Ambulatory Visit: Payer: Self-pay

## 2023-10-13 ENCOUNTER — Other Ambulatory Visit: Payer: Self-pay

## 2023-10-13 ENCOUNTER — Ambulatory Visit
Admission: RE | Admit: 2023-10-13 | Discharge: 2023-10-13 | Disposition: A | Discharge status: Home / Self Care | Attending: Podiatry | Admitting: Podiatry

## 2023-10-13 ENCOUNTER — Encounter: Payer: Self-pay | Admitting: Podiatry

## 2023-10-13 ENCOUNTER — Encounter
Admission: RE | Disposition: A | Discharge status: Home / Self Care | Payer: Self-pay | Source: Home / Self Care | Attending: Podiatry

## 2023-10-13 ENCOUNTER — Ambulatory Visit: Payer: Self-pay | Admitting: Anesthesiology

## 2023-10-13 DIAGNOSIS — Z01812 Encounter for preprocedural laboratory examination: Secondary | ICD-10-CM

## 2023-10-13 DIAGNOSIS — W19XXXA Unspecified fall, initial encounter: Secondary | ICD-10-CM | POA: Diagnosis not present

## 2023-10-13 DIAGNOSIS — S92351A Displaced fracture of fifth metatarsal bone, right foot, initial encounter for closed fracture: Secondary | ICD-10-CM | POA: Insufficient documentation

## 2023-10-13 DIAGNOSIS — S92351D Displaced fracture of fifth metatarsal bone, right foot, subsequent encounter for fracture with routine healing: Secondary | ICD-10-CM

## 2023-10-13 HISTORY — PX: ORIF TOE FRACTURE: SHX5032

## 2023-10-13 SURGERY — OPEN REDUCTION INTERNAL FIXATION (ORIF) METATARSAL (TOE) FRACTURE
Anesthesia: General | Site: Fifth Toe | Laterality: Right

## 2023-10-13 MED ORDER — ORAL CARE MOUTH RINSE: 15.0000 mL | Freq: Once | OROMUCOSAL | Status: DC

## 2023-10-13 MED ORDER — PROPOFOL 10 MG/ML IV BOLUS
INTRAVENOUS | Status: DC | PRN
  Administered 2023-10-13: 150 ug/kg/min via INTRAVENOUS
  Administered 2023-10-13: 200 mg via INTRAVENOUS

## 2023-10-13 MED ORDER — DEXAMETHASONE SODIUM PHOSPHATE 4 MG/ML IJ SOLN
INTRAMUSCULAR | Status: AC
  Filled 2023-10-13: qty 1

## 2023-10-13 MED ORDER — LACTATED RINGERS IV SOLN: INTRAVENOUS | Status: DC

## 2023-10-13 MED ORDER — CEFAZOLIN SODIUM-DEXTROSE 2-3 GM-%(50ML) IV SOLR
INTRAVENOUS | Status: AC
Start: 2023-10-13 — End: 2023-10-13
  Filled 2023-10-13: qty 50

## 2023-10-13 MED ORDER — ROCURONIUM BROMIDE 100 MG/10ML IV SOLN
INTRAVENOUS | Status: DC | PRN
Start: 2023-10-13 — End: 2023-10-13
  Administered 2023-10-13: 35 mg via INTRAVENOUS
  Administered 2023-10-13: 20 mg via INTRAVENOUS
  Administered 2023-10-13: 5 mg via INTRAVENOUS

## 2023-10-13 MED ORDER — OXYCODONE-ACETAMINOPHEN 5-325 MG PO TABS: 1.0000 | ORAL_TABLET | ORAL | 0 refills | Status: AC | PRN

## 2023-10-13 MED ORDER — KETOROLAC TROMETHAMINE 30 MG/ML IJ SOLN
INTRAMUSCULAR | Status: DC | PRN
  Administered 2023-10-13: 30 mg via INTRAVENOUS

## 2023-10-13 MED ORDER — BUPIVACAINE LIPOSOME 1.3 % IJ SUSP
INTRAMUSCULAR | Status: AC
  Filled 2023-10-13: qty 10

## 2023-10-13 MED ORDER — PROPOFOL 1000 MG/100ML IV EMUL
INTRAVENOUS | Status: AC
  Filled 2023-10-13: qty 100

## 2023-10-13 MED ORDER — FENTANYL CITRATE (PF) 100 MCG/2ML IJ SOLN
INTRAMUSCULAR | Status: AC
  Filled 2023-10-13: qty 2

## 2023-10-13 MED ORDER — KETOROLAC TROMETHAMINE 30 MG/ML IJ SOLN
INTRAMUSCULAR | Status: AC
Start: 2023-10-13 — End: 2023-10-13
  Filled 2023-10-13: qty 1

## 2023-10-13 MED ORDER — LIDOCAINE HCL (PF) 2 % IJ SOLN
INTRAMUSCULAR | Status: AC
  Filled 2023-10-13: qty 5

## 2023-10-13 MED ORDER — OXYCODONE HCL 5 MG PO TABS: 10.0000 mg | ORAL_TABLET | Freq: Once | ORAL | Status: DC

## 2023-10-13 MED ORDER — FENTANYL CITRATE (PF) 100 MCG/2ML IJ SOLN
INTRAMUSCULAR | Status: DC | PRN
  Administered 2023-10-13: 100 ug via INTRAVENOUS

## 2023-10-13 MED ORDER — DEXAMETHASONE SODIUM PHOSPHATE 4 MG/ML IJ SOLN
INTRAMUSCULAR | Status: DC | PRN
  Administered 2023-10-13: 4 mg via INTRAVENOUS

## 2023-10-13 MED ORDER — SUCCINYLCHOLINE CHLORIDE 200 MG/10ML IV SOSY
PREFILLED_SYRINGE | INTRAVENOUS | Status: DC | PRN
  Administered 2023-10-13: 140 mg via INTRAVENOUS

## 2023-10-13 MED ORDER — ACETAMINOPHEN 10 MG/ML IV SOLN
INTRAVENOUS | Status: AC
  Filled 2023-10-13: qty 100

## 2023-10-13 MED ORDER — MIDAZOLAM HCL 2 MG/2ML IJ SOLN
INTRAMUSCULAR | Status: AC
  Filled 2023-10-13: qty 2

## 2023-10-13 MED ORDER — SUCCINYLCHOLINE CHLORIDE 200 MG/10ML IV SOSY
PREFILLED_SYRINGE | INTRAVENOUS | Status: AC
  Filled 2023-10-13: qty 10

## 2023-10-13 MED ORDER — SCOPOLAMINE 1 MG/3DAYS TD PT72
1.0000 | MEDICATED_PATCH | Freq: Once | TRANSDERMAL | Status: DC
  Administered 2023-10-13: 1.5 mg via TRANSDERMAL

## 2023-10-13 MED ORDER — SUGAMMADEX SODIUM 200 MG/2ML IV SOLN
INTRAVENOUS | Status: AC
  Filled 2023-10-13: qty 2

## 2023-10-13 MED ORDER — DEXMEDETOMIDINE HCL IN NACL 80 MCG/20ML IV SOLN
INTRAVENOUS | Status: DC | PRN
  Administered 2023-10-13: 8 ug via INTRAVENOUS
  Administered 2023-10-13: 4 ug via INTRAVENOUS

## 2023-10-13 MED ORDER — SCOPOLAMINE 1 MG/3DAYS TD PT72
MEDICATED_PATCH | TRANSDERMAL | Status: AC
  Filled 2023-10-13: qty 1

## 2023-10-13 MED ORDER — ONDANSETRON HCL 4 MG/2ML IJ SOLN
INTRAMUSCULAR | Status: AC
  Filled 2023-10-13: qty 2

## 2023-10-13 MED ORDER — LIDOCAINE HCL (CARDIAC) PF 100 MG/5ML IV SOSY
PREFILLED_SYRINGE | INTRAVENOUS | Status: DC | PRN
  Administered 2023-10-13: 100 mg via INTRAVENOUS

## 2023-10-13 MED ORDER — ALBUTEROL SULFATE (2.5 MG/3ML) 0.083% IN NEBU: 2.5000 mg | INHALATION_SOLUTION | Freq: Once | RESPIRATORY_TRACT | Status: DC

## 2023-10-13 MED ORDER — BUPIVACAINE LIPOSOME 1.3 % IJ SUSP
INTRAMUSCULAR | Status: DC | PRN
Start: 2023-10-13 — End: 2023-10-13
  Administered 2023-10-13: 10 mL

## 2023-10-13 MED ORDER — ASPIRIN 81 MG PO TBEC: 81.0000 mg | DELAYED_RELEASE_TABLET | Freq: Every day | ORAL | 0 refills | Status: AC

## 2023-10-13 MED ORDER — CHLORHEXIDINE GLUCONATE 0.12 % MT SOLN: 15.0000 mL | Freq: Once | OROMUCOSAL | Status: DC

## 2023-10-13 MED ORDER — ROCURONIUM BROMIDE 10 MG/ML (PF) SYRINGE
PREFILLED_SYRINGE | INTRAVENOUS | Status: AC
  Filled 2023-10-13: qty 10

## 2023-10-13 MED ORDER — 0.9 % SODIUM CHLORIDE (POUR BTL) OPTIME
TOPICAL | Status: DC | PRN
  Administered 2023-10-13: 500 mL

## 2023-10-13 MED ORDER — MIDAZOLAM HCL 5 MG/5ML IJ SOLN
INTRAMUSCULAR | Status: DC | PRN
Start: 2023-10-13 — End: 2023-10-13
  Administered 2023-10-13: 2 mg via INTRAVENOUS

## 2023-10-13 MED ORDER — ONDANSETRON HCL 4 MG/2ML IJ SOLN
4.0000 mg | Freq: Once | INTRAMUSCULAR | Status: AC
  Administered 2023-10-13: 4 mg via INTRAVENOUS

## 2023-10-13 MED ORDER — BUPIVACAINE HCL (PF) 0.25 % IJ SOLN
INTRAMUSCULAR | Status: DC | PRN
  Administered 2023-10-13: 10 mL

## 2023-10-13 MED ORDER — SUGAMMADEX SODIUM 200 MG/2ML IV SOLN
INTRAVENOUS | Status: DC | PRN
  Administered 2023-10-13: 300 mg via INTRAVENOUS

## 2023-10-13 MED ORDER — CEFAZOLIN SODIUM-DEXTROSE 2-4 GM/100ML-% IV SOLN
2.0000 g | INTRAVENOUS | Status: AC
  Administered 2023-10-13: 2 g via INTRAVENOUS

## 2023-10-13 MED ORDER — ALBUTEROL SULFATE (2.5 MG/3ML) 0.083% IN NEBU
INHALATION_SOLUTION | RESPIRATORY_TRACT | Status: AC
Start: 2023-10-13 — End: 2023-10-13
  Filled 2023-10-13: qty 3

## 2023-10-13 MED ORDER — ALBUTEROL SULFATE (2.5 MG/3ML) 0.083% IN NEBU
2.5000 mg | INHALATION_SOLUTION | Freq: Four times a day (QID) | RESPIRATORY_TRACT | Status: DC | PRN
  Administered 2023-10-13: 2.5 mg via RESPIRATORY_TRACT

## 2023-10-13 MED ORDER — DEXMEDETOMIDINE HCL IN NACL 80 MCG/20ML IV SOLN
INTRAVENOUS | Status: AC
  Filled 2023-10-13: qty 20

## 2023-10-13 SURGICAL SUPPLY — 33 items
BIT DRILL 100X1.3XAO CNCT (BIT) IMPLANT
BNDG ELASTIC 4X5.8 VLCR NS LF (GAUZE/BANDAGES/DRESSINGS) ×1 IMPLANT
BNDG ESMARCH 4X12 STRL LF (GAUZE/BANDAGES/DRESSINGS) ×1 IMPLANT
BNDG GAUZE DERMACEA FLUFF 4 (GAUZE/BANDAGES/DRESSINGS) ×1 IMPLANT
BOOT STEPPER DURA MED (SOFTGOODS) IMPLANT
CANISTER SUCT 1200ML W/VALVE (MISCELLANEOUS) ×1 IMPLANT
COVER LIGHT HANDLE UNIVERSAL (MISCELLANEOUS) ×2 IMPLANT
DURAPREP 26ML APPLICATOR (WOUND CARE) ×2 IMPLANT
ELECTRODE REM PT RTRN 9FT ADLT (ELECTROSURGICAL) ×1 IMPLANT
GAUZE 4X4 16PLY ~~LOC~~+RFID DBL (SPONGE) ×1 IMPLANT
GAUZE SPONGE 4X4 12PLY STRL (GAUZE/BANDAGES/DRESSINGS) ×1 IMPLANT
GAUZE XEROFORM 1X8 LF (GAUZE/BANDAGES/DRESSINGS) ×1 IMPLANT
GLOVE BIOGEL PI IND STRL 7.5 (GLOVE) ×1 IMPLANT
GLOVE SURG SS PI 7.0 STRL IVOR (GLOVE) ×1 IMPLANT
GOWN STRL REUS W/ TWL LRG LVL3 (GOWN DISPOSABLE) ×2 IMPLANT
GRAFT DMB PUTTY 1 OPTIUM FD (Bone Implant) IMPLANT
GUIDEWIRE SMOOTH 1.2X100 (WIRE) IMPLANT
KIT TURNOVER KIT A (KITS) ×1 IMPLANT
NS IRRIG 500ML POUR BTL (IV SOLUTION) ×1 IMPLANT
PACK EXTREMITY ARMC (MISCELLANEOUS) ×1 IMPLANT
PADDING CAST BLEND 4X4 NS (MISCELLANEOUS) ×3 IMPLANT
PENCIL SMOKE EVACUATOR (MISCELLANEOUS) ×1 IMPLANT
PLATE JMF HOOK 5H RT COMP (Plate) IMPLANT
SCREW 2.0 X10 LOCKING (Screw) IMPLANT
SCREW NLOCK 2.0X13 (Screw) IMPLANT
SPLINT CAST 1 STEP 4X30 (MISCELLANEOUS) ×1 IMPLANT
STOCKINETTE IMPERVIOUS LG (DRAPES) IMPLANT
STOCKINETTE ORTHO 6X25 (MISCELLANEOUS) ×1 IMPLANT
SUT ETHILON 3-0 (SUTURE) IMPLANT
SUT VIC AB 3-0 SH 27X BRD (SUTURE) IMPLANT
SUT VIC AB 4-0 FS2 27 (SUTURE) ×1 IMPLANT
SUT VIC AB 4-0 SH 27XANBCTRL (SUTURE) IMPLANT
WIRE OLIVE SMOOTH 1.3 (WIRE) IMPLANT

## 2023-10-13 NOTE — Op Note (Signed)
 PODIATRY / FOOT AND ANKLE SURGERY OPERATIVE REPORT    SURGEON: Prentice Lee, DPM  ASSISTANT: Greig Blush, DPM  PRE-OPERATIVE DIAGNOSIS:  Right fifth metatarsal fracture closed, displaced  POST-OPERATIVE DIAGNOSIS: Same  PROCEDURE(S): Right fifth metatarsal fracture open reduction with internal fixation  HEMOSTASIS: Right ankle tourniquet  ANESTHESIA: MAC  ESTIMATED BLOOD LOSS: 10 cc  FINDING(S): 1.  Slightly displaced avulsion fracture to the right fifth metatarsal base  PATHOLOGY/SPECIMEN(S): None  INDICATIONS:   Brooke Pearson is a 38 y.o. female who presents with a displaced fracture to the right fifth metatarsal after taking a misstep.  Patient was placed into a boot and came into clinic for further evaluation.  Patient unfortunately had to put off doing any type of surgical intervention due to scheduling conflicts.  Discussed with patient that she has a displaced fracture of the fifth metatarsal which will likely heal better with surgical intervention due to the displacement present.  Patient understands.  All treatment options were discussed with the patient of both conservative and surgical attempts at correction including potential risks and complications.  Patient has elected for procedure consisting of right fifth metatarsal base fracture open reduction with internal fixation.  No guarantees given.  Consent obtained. .  DESCRIPTION: After obtaining full informed written consent, the patient was brought back to the operating room and placed supine upon the operating table.  The patient received IV antibiotics prior to induction.  After obtaining adequate anesthesia, the patient was prepped and draped in the standard fashion.  A preoperative block was performed with 20 cc of quarter percent Marcaine  plain an Esmarch bandage was used to exsanguinate the right lower extremity pneumatic ankle tourniquet was inflated..  Intraoperative C-arm imaging was then taken showing a  slightly displaced avulsion fracture to the fifth metatarsal base with plantar gapping and lateral displacement as well.  Attention was directed to the fifth metatarsal base where linear longitudinal incision was made over the area.  The incision was deepened through the subcutaneous tissues utilizing sharp and blunt dissection care was taken to identify and retract all vital neurovascular structures and all venous contributories were cauterized necessary.  The sural nerve was identified and retracted plantarly throughout the remainder of the case.  A periosteal incision was then made into the fifth metatarsal base and proximal shaft and the periosteum was reflected dorsally and plantarly thereby expose the fifth metatarsal base the operative site.  Care was taken to not disturb the peroneal tendons especially the peroneus brevis at the insertion of the fifth metatarsal base leaving as much attached as possible.  The fracture was then visualized and appeared to be partially healing but in a slightly displaced position.  A combination of osteotome and curette was then used to remove any of the fibrous tissue that was between the fracture fragments at this time.  The fracture was then mobilized and able to be reduced with manual reduction.  While holding in manual direction a temporary wire was placed from the fifth metatarsal base across the fracture fragment and into the fifth metatarsal shaft.  This was checked under fluoroscopic guidance and appeared to be in appropriate position.  The fifth metatarsal appeared to be well aligned overall.  There appeared to be some slight plantar gapping still so DBM was then packed into the area of the slight plantar gap.  A hook plate from [employer] was then placed across the fracture site with the tongs going into the peroneal tendon and fifth metatarsal base area.  This was tamped into place.  This was then checked under fluoroscopic guidance and appeared to sit in a good  position overall.  At this time the all of wire temporary fixation was placed.  The compression screw was then placed utilizing standard AO principles and techniques and prior to placing and seating the compression screw completely the temporary olive wire was removed.  The screw was a 2.0 x 13 mm nonlocking screw.  This appeared to sit well and compressed the fracture fairly well overall.  2 distal screws were placed distal to the fracture site and were locking screws as well that were 2.0 in nature.  Overall the fracture appeared to be very stable at this time with minimal to no motion at all when rechecking stress in the area.  Further DBM was then packed into the area of the slight plantar gap which appeared to be well compressed at this time.  Final C-arm imaging was then taken showing adequate plate and screw fixation of the appropriate length and size.  The fracture appeared to be well compressed and in near anatomic position.  Deep closure was then obtained consisting of 3-0 Vicryl to the periosteal tissue and 3-0 Vicryl to the subcutaneous tissue.  The skin was then reapproximated well coapted with 4-0 nylon in horizontal mattress type stitching.  An additional 10 cc of Exparel  was injected about the operative area.  The pneumatic ankle tourniquet was released and a prompt hyperemic response was noted all digits the right foot.  A postoperative dressing was then applied consisting of Xeroform followed by 4 x 4 gauze, gauze roll, Kerlix, Ace wrap.  The patient tolerated the procedure and anesthesia well was transferred to the recovery room with vital signs stable and vascular status intact all toes of the right foot.  Following a period of  postoperative monitoring the patient be discharged home with the appropriate orders, instructions, and medications.  COMPLICATIONS: None  CONDITION: Good, stable  Prentice Lee, DPM

## 2023-10-13 NOTE — Anesthesia Postprocedure Evaluation (Signed)
 Anesthesia Post Note  Patient: Brooke Pearson  Procedure(s) Performed: OPEN REDUCTION INTERNAL FIXATION (ORIF) METATARSAL (TOE) FRACTURE (Right: Fifth Toe)  Patient location during evaluation: PACU Anesthesia Type: General Level of consciousness: awake and alert Pain management: pain level controlled Vital Signs Assessment: post-procedure vital signs reviewed and stable Respiratory status: spontaneous breathing, nonlabored ventilation, respiratory function stable and patient connected to nasal cannula oxygen Cardiovascular status: blood pressure returned to baseline and stable Postop Assessment: no apparent nausea or vomiting Anesthetic complications: no   No notable events documented.   Last Vitals:  Vitals:   10/13/23 1415 10/13/23 1430  BP: (!) 109/55 117/70  Pulse: 73 80  Resp: 18 18  Temp:    SpO2: 100% 100%    Last Pain:  Vitals:   10/13/23 1430  TempSrc:   PainSc: 0-No pain                 Arney Mayabb C Ezinne Yogi

## 2023-10-13 NOTE — Anesthesia Procedure Notes (Addendum)
 Procedure Name: Intubation Date/Time: 10/13/2023 12:20 PM  Performed by: Anice Melnick, CRNAPre-anesthesia Checklist: Patient identified, Patient being monitored, Timeout performed, Emergency Drugs available and Suction available Patient Re-evaluated:Patient Re-evaluated prior to induction Oxygen Delivery Method: Circle system utilized Preoxygenation: Pre-oxygenation with 100% oxygen Induction Type: IV induction and Rapid sequence Laryngoscope Size: Mac, McGrath and 4 Grade View: Grade I Tube type: Oral Tube size: 7.0 mm Number of attempts: 1 Airway Equipment and Method: Stylet Placement Confirmation: ETT inserted through vocal cords under direct vision, positive ETCO2 and breath sounds checked- equal and bilateral Secured at: 22 cm Tube secured with: Tape Dental Injury: Teeth and Oropharynx as per pre-operative assessment

## 2023-10-13 NOTE — Transfer of Care (Signed)
 Immediate Anesthesia Transfer of Care Note  Patient: Brooke Pearson  Procedure(s) Performed: OPEN REDUCTION INTERNAL FIXATION (ORIF) METATARSAL (TOE) FRACTURE (Right: Fifth Toe)  Patient Location: PACU  Anesthesia Type: General ETT  Level of Consciousness: awake, alert  and patient cooperative  Airway and Oxygen Therapy: Patient Spontanous Breathing and Patient connected to supplemental oxygen  Post-op Assessment: Post-op Vital signs reviewed, Patient's Cardiovascular Status Stable, Respiratory Function Stable, Patent Airway and No signs of Nausea or vomiting  Post-op Vital Signs: Reviewed and stable  Complications: No notable events documented.

## 2023-10-13 NOTE — H&P (Signed)
 HISTORY AND PHYSICAL INTERVAL NOTE:  10/13/2023  11:04 AM  Brooke Pearson  has presented today for surgery, with the diagnosis of Closed displaced fracture of fifth metatarsal bone of right foot, initial encounter.  The various methods of treatment have been discussed with the patient.  No guarantees were given.  After consideration of risks, benefits and other options for treatment, the patient has consented to surgery.  I have reviewed the patients' chart and labs.    PROCEDURE: Right 5th metatarsal fracture ORIF  A history and physical examination was performed in my office.  The patient was reexamined.  There have been no changes to this history and physical examination.  Prentice Lee, DPM

## 2023-10-14 ENCOUNTER — Encounter: Payer: Self-pay | Admitting: Podiatry

## 2023-12-20 ENCOUNTER — Encounter: Payer: Self-pay | Admitting: Oncology
